# Patient Record
Sex: Female | Born: 1980 | Race: Black or African American | Hispanic: No | Marital: Single | Smoking: Never smoker
Health system: Southern US, Community
[De-identification: ages and names within clinical notes are randomized; demographics above are authoritative.]

## PROBLEM LIST (undated history)

## (undated) DIAGNOSIS — I10 Essential (primary) hypertension: Secondary | ICD-10-CM

## (undated) DIAGNOSIS — M069 Rheumatoid arthritis, unspecified: Secondary | ICD-10-CM

## (undated) HISTORY — PX: TONSILLECTOMY: SUR1361

---

## 2011-11-08 ENCOUNTER — Other Ambulatory Visit (HOSPITAL_COMMUNITY)
Admission: RE | Admit: 2011-11-08 | Discharge: 2011-11-08 | Disposition: A | Discharge status: Home / Self Care | Payer: BC Managed Care – PPO | Source: Ambulatory Visit | Attending: Internal Medicine | Admitting: Internal Medicine

## 2011-11-08 DIAGNOSIS — Z01419 Encounter for gynecological examination (general) (routine) without abnormal findings: Secondary | ICD-10-CM | POA: Insufficient documentation

## 2011-11-08 DIAGNOSIS — Z1151 Encounter for screening for human papillomavirus (HPV): Secondary | ICD-10-CM | POA: Insufficient documentation

## 2012-04-04 ENCOUNTER — Encounter (HOSPITAL_COMMUNITY): Payer: Self-pay | Admitting: *Deleted

## 2012-04-04 ENCOUNTER — Emergency Department (HOSPITAL_COMMUNITY)
Admission: EM | Admit: 2012-04-04 | Discharge: 2012-04-05 | Disposition: A | Discharge status: Home / Self Care | Payer: BC Managed Care – PPO | Attending: Emergency Medicine | Admitting: Emergency Medicine

## 2012-04-04 DIAGNOSIS — M069 Rheumatoid arthritis, unspecified: Secondary | ICD-10-CM | POA: Insufficient documentation

## 2012-04-04 DIAGNOSIS — Z79899 Other long term (current) drug therapy: Secondary | ICD-10-CM | POA: Insufficient documentation

## 2012-04-04 DIAGNOSIS — K8020 Calculus of gallbladder without cholecystitis without obstruction: Secondary | ICD-10-CM

## 2012-04-04 DIAGNOSIS — I10 Essential (primary) hypertension: Secondary | ICD-10-CM | POA: Insufficient documentation

## 2012-04-04 DIAGNOSIS — R110 Nausea: Secondary | ICD-10-CM | POA: Insufficient documentation

## 2012-04-04 DIAGNOSIS — Z3202 Encounter for pregnancy test, result negative: Secondary | ICD-10-CM | POA: Insufficient documentation

## 2012-04-04 DIAGNOSIS — R112 Nausea with vomiting, unspecified: Secondary | ICD-10-CM | POA: Insufficient documentation

## 2012-04-04 HISTORY — DX: Essential (primary) hypertension: I10

## 2012-04-04 HISTORY — DX: Rheumatoid arthritis, unspecified: M06.9

## 2012-04-04 LAB — CBC WITH DIFFERENTIAL/PLATELET
Eosinophils Absolute: 0.1 10*3/uL (ref 0.0–0.7)
Eosinophils Relative: 1 % (ref 0–5)
Lymphs Abs: 4.2 10*3/uL — ABNORMAL HIGH (ref 0.7–4.0)
MCH: 28.5 pg (ref 26.0–34.0)
MCV: 80.3 fL (ref 78.0–100.0)
Monocytes Absolute: 0.7 10*3/uL (ref 0.1–1.0)
Monocytes Relative: 6 % (ref 3–12)
Platelets: 411 10*3/uL — ABNORMAL HIGH (ref 150–400)
RBC: 4 MIL/uL (ref 3.87–5.11)

## 2012-04-04 LAB — URINALYSIS, MICROSCOPIC ONLY
Nitrite: NEGATIVE
Specific Gravity, Urine: 1.017 (ref 1.005–1.030)
Urobilinogen, UA: 0.2 mg/dL (ref 0.0–1.0)

## 2012-04-04 LAB — COMPREHENSIVE METABOLIC PANEL
BUN: 13 mg/dL (ref 6–23)
Calcium: 9.3 mg/dL (ref 8.4–10.5)
Creatinine, Ser: 0.87 mg/dL (ref 0.50–1.10)
GFR calc Af Amer: >90 mL/min (ref >90)
Glucose, Bld: 106 mg/dL — ABNORMAL HIGH (ref 70–99)
Sodium: 141 mEq/L (ref 135–145)
Total Protein: 7.4 g/dL (ref 6.0–8.3)

## 2012-04-04 LAB — LIPASE, BLOOD: Lipase: 46 U/L (ref 11–59)

## 2012-04-04 NOTE — ED Notes (Signed)
Pt c/o RUQ pain radiating to right back since this morning. Nausea, vomited x 1.  Denies dysuria, fever/chills.

## 2012-04-05 ENCOUNTER — Emergency Department (HOSPITAL_COMMUNITY): Payer: BC Managed Care – PPO

## 2012-04-05 MED ORDER — ONDANSETRON HCL 4 MG PO TABS: 4.0000 mg | ORAL_TABLET | Freq: Four times a day (QID) | ORAL | Status: DC

## 2012-04-05 MED ORDER — OXYCODONE-ACETAMINOPHEN 5-325 MG PO TABS: 2.0000 | ORAL_TABLET | ORAL | Status: DC | PRN

## 2012-04-05 MED ORDER — MORPHINE SULFATE 4 MG/ML IJ SOLN
6.0000 mg | INTRAMUSCULAR | Status: DC | PRN
  Administered 2012-04-05: 6 mg via INTRAVENOUS
  Filled 2012-04-05: qty 1
  Filled 2012-04-05: qty 2

## 2012-04-05 MED ORDER — SODIUM CHLORIDE 0.9 % IV SOLN
Freq: Once | INTRAVENOUS | Status: AC
  Administered 2012-04-05: 01:00:00 via INTRAVENOUS

## 2012-04-05 MED ORDER — ONDANSETRON HCL 4 MG/2ML IJ SOLN
4.0000 mg | Freq: Once | INTRAMUSCULAR | Status: AC
  Administered 2012-04-05: 4 mg via INTRAVENOUS
  Filled 2012-04-05: qty 2

## 2012-04-05 NOTE — ED Provider Notes (Signed)
History     CSN: [id number]  Arrival date & time 04/04/12  2228   First MD Initiated Contact with Patient 04/05/12 [phone]      Chief Complaint  Patient presents with  . Abdominal Pain    (Consider location/radiation/quality/duration/timing/severity/associated sxs/prior treatment) HPI Comments: Patient noticed acute onset of right upper quadrant pain after eating breakfast.  This morning, associated with nausea.  The pain persisted waxed and waned throughout the day after dinner which consisted of a chicken wrap.  Pain became excruciating.  She then vomited several times into the emergency room for further evaluation.  Has no known history of gallbladder disease.  She does have a history of rheumatoid arthritis, and hypertension, where she is followed by primary care Dr. And rheumatology  Patient is a 32 y.o. female presenting with abdominal pain. The history is provided by the patient.  Abdominal Pain Pain location:  RUQ Pain quality: aching   Pain radiates to:  Does not radiate Pain severity:  Moderate Timing:  Constant Progression:  Worsening Chronicity:  New Relieved by:  Nothing Worsened by:  Eating Associated symptoms: nausea and vomiting   Associated symptoms: no chest pain, no chills, no diarrhea, no dysuria and no fever     Past Medical History  Diagnosis Date  . Rheumatoid arthritis   . Hypertension     Past Surgical History  Procedure Laterality Date  . Tonsillectomy      History reviewed. No pertinent family history.  History  Substance Use Topics  . Smoking status: Never Smoker   . Smokeless tobacco: Not on file  . Alcohol Use: Yes    OB History   Grav Para Term Preterm Abortions TAB SAB Ect Mult Living                  Review of Systems  Constitutional: Negative for fever and chills.  Cardiovascular: Negative for chest pain and leg swelling.  Gastrointestinal: Positive for nausea, vomiting and abdominal pain. Negative for diarrhea.   Genitourinary: Negative for dysuria and flank pain.  Musculoskeletal: Negative for myalgias.  Skin: Negative for pallor.  All other systems reviewed and are negative.    Allergies  Review of patient's allergies indicates no known allergies.  Home Medications   Current Outpatient Rx  Name  Route  Sig  Dispense  Refill  . diclofenac (VOLTAREN) 75 MG EC tablet   Oral   Take 75 mg by mouth 2 (two) times daily.         Marland Kitchen etanercept (ENBREL) 50 MG/ML injection   Subcutaneous   Inject 50 mg into the skin once a week. Tuesdays         . hydrochlorothiazide (HYDRODIURIL) 25 MG tablet   Oral   Take 25 mg by mouth daily.         . hydroxychloroquine (PLAQUENIL) 200 MG tablet   Oral   Take 200 mg by mouth daily.         Marland Kitchen PRESCRIPTION MEDICATION      Oral contraceptive         . ondansetron (ZOFRAN) 4 MG tablet   Oral   Take 1 tablet (4 mg total) by mouth every 6 (six) hours.   12 tablet   0   . oxyCODONE-acetaminophen (PERCOCET/ROXICET) 5-325 MG per tablet   Oral   Take 2 tablets by mouth every 4 (four) hours as needed for pain.   15 tablet   0     BP 109/57  Pulse  81  Temp(Src) 97.8 F (36.6 C) (Oral)  Resp 18  SpO2 98%  LMP 03/18/2012  Physical Exam  Constitutional: She appears well-developed and well-nourished.  HENT:  Head: Normocephalic.  Cardiovascular: Normal rate and regular rhythm.   Pulmonary/Chest: Effort normal and breath sounds normal.  Abdominal: Soft. Bowel sounds are normal. She exhibits no distension. There is tenderness in the right upper quadrant. There is guarding and positive Murphy's sign.    ED Course  Procedures (including critical care time)  Labs Reviewed  CBC WITH DIFFERENTIAL - Abnormal; Notable for the following:    WBC 10.8 (*)    Hemoglobin 11.4 (*)    HCT 32.1 (*)    Platelets 411 (*)    Lymphs Abs 4.2 (*)    All other components within normal limits  COMPREHENSIVE METABOLIC PANEL - Abnormal; Notable for the  following:    Glucose, Bld 106 (*)    Total Bilirubin 0.2 (*)    GFR calc non Af Amer 88 (*)    All other components within normal limits  URINALYSIS, MICROSCOPIC ONLY - Abnormal; Notable for the following:    Casts HYALINE CASTS (*)    All other components within normal limits  LIPASE, BLOOD  POCT PREGNANCY, URINE  POCT I-STAT TROPONIN I   US Abdomen Complete  04/05/2012  *RADIOLOGY REPORT*  Clinical Data:  Right upper quadrant pain  COMPLETE ABDOMINAL ULTRASOUND  Comparison:  None.  Findings:  Gallbladder:  There is a mobile 7 mm gallstone in the gallbladder. The gallbladder wall is mildly thickened at 3 mm.  There is no pericholecystic fluid.  Negative sonographic Murphy's sign.  Common bile duct:  Normal diameter 5 mm.  Liver:  No focal lesion identified.  Within normal limits in parenchymal echogenicity.  IVC:  Appears normal.  Pancreas:  No focal abnormality seen.  Spleen:  Normal in size and echogenicity.  Right Kidney:  No hydronephrosis appear  Left Kidney:  No hydronephrosis appear  Abdominal aorta:  no aneurysm  IMPRESSION:  Cholelithiasis without cholecystitis.   Original Report Authenticated By: Genevive Bi, M.D.      1. Cholelithiasis       MDM  Patient exam is consistent with acute cholecystitis.  We'll obtain ultrasound, labs reviewed, slight elevation in white count to 10.8, and 0.2 to otherwise, normal         Arman Filter, NP 04/05/12 [phone]

## 2012-04-05 NOTE — ED Provider Notes (Signed)
Medical screening examination/treatment/procedure(s) were conducted as a shared visit with non-physician practitioner(s) and myself.  I personally evaluated the patient during the encounter. Pain resolved. [place] reviewed  RUQ pain with gallstone. ABD s/nt/nd with heart RRR.  [facility] notified, plan outpatient follow up - GB precautions provided.   Sunnie Nielsen, MD 04/05/12 [phone]

## 2012-04-05 NOTE — ED Notes (Signed)
Pt back from US

## 2012-04-05 NOTE — ED Notes (Signed)
CCS paged to [phone] Deanna Higgins

## 2012-04-05 NOTE — ED Notes (Signed)
Patient transported to Ultrasound 

## 2012-04-10 ENCOUNTER — Ambulatory Visit (INDEPENDENT_AMBULATORY_CARE_PROVIDER_SITE_OTHER): Payer: BC Managed Care – PPO | Admitting: General Surgery

## 2012-04-10 ENCOUNTER — Encounter (INDEPENDENT_AMBULATORY_CARE_PROVIDER_SITE_OTHER): Payer: Self-pay | Admitting: General Surgery

## 2012-04-10 ENCOUNTER — Encounter (HOSPITAL_COMMUNITY): Payer: Self-pay | Admitting: Pharmacy Technician

## 2012-04-10 VITALS — BP 128/82 | HR 84 | Temp 98.0°F | Resp 16 | Ht 61.0 in | Wt 161.0 lb

## 2012-04-10 DIAGNOSIS — K8020 Calculus of gallbladder without cholecystitis without obstruction: Secondary | ICD-10-CM

## 2012-04-10 NOTE — Progress Notes (Signed)
Patient ID: Deanna Higgins, female   DOB: Oct 18, 1980, 32 y.o.   MRN: [id number]  Chief Complaint  Patient presents with  . New Evaluation    eval GB    HPI Deanna Higgins is a 32 y.o. female.   HPI 32 yo AAF referred by Dr Theodoro Kalata for evaluation of gallstone disease. The patient states she has had some intermittent episodes over the past couple weeks of right upper quadrant pain radiating to her back. She describes it as an achy sensation. It generally lasts for about 30 minutes to an hour. It was primarily occur in the evening. However a few days ago she had an episode that lasted all day. It was initially intermittent in the morning but became constant later in the evening. She developed nausea and vomiting and the pain was very severe. This prompted her to go to the emergency room for evaluation where an ultrasound was performed which demonstrated gallstones. She had a mild elevated white count at 10.8 but otherwise her labs were within normal limits. Her pain resolved and she was discharged home. She states that she had another mild attack last evening but it resolved after taking ibuprofen. She has lost about 20 pounds over the past couple months which has been planned. She normally has a bowel movement about every 3-4 days. She denies any jaundice or acholic stools. He does take NSAIDs on occasion for her RA. She denies any melena or hematochezia. She denies any reflux Past Medical History  Diagnosis Date  . Rheumatoid arthritis   . Hypertension     Past Surgical History  Procedure Laterality Date  . Tonsillectomy      Family History  Problem Relation Age of Onset  . Hypertension Maternal Aunt   . Cancer Maternal Grandmother     breast  . Diabetes Maternal Grandfather   . Cancer Paternal Grandmother     gastric    Social History History  Substance Use Topics  . Smoking status: Never Smoker   . Smokeless tobacco: Not on file  . Alcohol Use: Yes    No Known  Allergies  Current Outpatient Prescriptions  Medication Sig Dispense Refill  . diclofenac (VOLTAREN) 75 MG EC tablet Take 75 mg by mouth 2 (two) times daily.      Marland Kitchen etanercept (ENBREL) 50 MG/ML injection Inject 50 mg into the skin once a week. Tuesdays      . hydrochlorothiazide (HYDRODIURIL) 25 MG tablet Take 25 mg by mouth daily.      . hydroxychloroquine (PLAQUENIL) 200 MG tablet Take 200 mg by mouth daily.      . ondansetron (ZOFRAN) 4 MG tablet Take 1 tablet (4 mg total) by mouth every 6 (six) hours.  12 tablet  0  . oxyCODONE-acetaminophen (PERCOCET/ROXICET) 5-325 MG per tablet Take 2 tablets by mouth every 4 (four) hours as needed for pain.  15 tablet  0  . PRESCRIPTION MEDICATION Oral contraceptive       No current facility-administered medications for this visit.    Review of Systems Review of Systems  Constitutional: Negative for fever, chills and unexpected weight change.  HENT: Negative for hearing loss, congestion, sore throat, trouble swallowing and voice change.   Eyes: Negative for visual disturbance.  Respiratory: Negative for cough and wheezing.   Cardiovascular: Negative for chest pain, palpitations and leg swelling.  Gastrointestinal: Positive for constipation. Negative for vomiting, diarrhea, blood in stool, abdominal distention and anal bleeding.  Genitourinary: Negative for hematuria, vaginal bleeding  and difficulty urinating.  Musculoskeletal: Negative for arthralgias.  Skin: Negative for rash and wound.  Neurological: Negative for dizziness, seizures, syncope, weakness, numbness and headaches.  Hematological: Negative for adenopathy. Does not bruise/bleed easily.  Psychiatric/Behavioral: Negative for confusion.    Blood pressure 128/82, pulse 84, temperature 98 F (36.7 C), temperature source Temporal, resp. rate 16, height 5\' 1"  (1.549 m), weight 161 lb (73.029 kg), last menstrual period 03/18/2012.  Physical Exam Physical Exam  Vitals  reviewed. Constitutional: She is oriented to person, place, and time. She appears well-developed and well-nourished. No distress.  HENT:  Head: Normocephalic and atraumatic.  Right Ear: External ear normal.  Left Ear: External ear normal.  Eyes: Conjunctivae are normal. No scleral icterus.  Neck: Neck supple. No tracheal deviation present. No thyromegaly present.  Cardiovascular: Normal rate, regular rhythm and normal heart sounds.   Pulmonary/Chest: Effort normal and breath sounds normal. No respiratory distress. She has no wheezes.  Abdominal: Soft. She exhibits no distension. There is no tenderness. There is no rebound and no guarding.  Musculoskeletal: She exhibits no edema and no tenderness.  Lymphadenopathy:    She has no cervical adenopathy.  Neurological: She is alert and oriented to person, place, and time.  Skin: Skin is warm and dry. No rash noted. She is not diaphoretic. No erythema.  Psychiatric: She has a normal mood and affect. Her behavior is normal. Judgment and thought content normal.    Data Reviewed Dr Dierdre Highman notes Labs - cmet wnl   Assessment    Symptomatic cholelithiasis     Plan    I believe the patient's symptoms are consistent with gallbladder disease.  We discussed gallbladder disease. The patient was given Agricultural engineer. We discussed non-operative and operative management. We discussed the signs & symptoms of acute cholecystitis  I discussed laparoscopic cholecystectomy with IOC in detail.  The patient was given educational material as well as diagrams detailing the procedure.  We discussed the risks and benefits of a laparoscopic cholecystectomy including, but not limited to bleeding, infection, injury to surrounding structures such as the intestine or liver, bile leak, retained gallstones, need to convert to an open procedure, prolonged diarrhea, blood clots such as  DVT, common bile duct injury, anesthesia risks, and possible need for additional  procedures.  We discussed the typical post-operative recovery course. I explained that the likelihood of improvement of their symptoms is good.  She will be scheduled for laparoscopic cholecystectomy with interoperative cholangiogram in the near future.  Mary Sella. Andrey Campanile, MD, FACS General, Bariatric, & Minimally Invasive Surgery [facility], [place]          [facility] M 04/10/2012, 9:33 AM

## 2012-04-10 NOTE — Patient Instructions (Signed)

## 2012-04-12 ENCOUNTER — Ambulatory Visit (HOSPITAL_COMMUNITY)
Admission: RE | Admit: 2012-04-12 | Discharge: 2012-04-12 | Disposition: A | Discharge status: Home / Self Care | Payer: BC Managed Care – PPO | Source: Ambulatory Visit | Attending: Anesthesiology | Admitting: Anesthesiology

## 2012-04-12 ENCOUNTER — Other Ambulatory Visit (INDEPENDENT_AMBULATORY_CARE_PROVIDER_SITE_OTHER): Payer: Self-pay | Admitting: General Surgery

## 2012-04-12 ENCOUNTER — Encounter (HOSPITAL_COMMUNITY): Payer: Self-pay

## 2012-04-12 ENCOUNTER — Encounter (HOSPITAL_COMMUNITY)
Admission: RE | Admit: 2012-04-12 | Discharge: 2012-04-12 | Disposition: A | Discharge status: Home / Self Care | Payer: BC Managed Care – PPO | Source: Ambulatory Visit | Attending: General Surgery | Admitting: General Surgery

## 2012-04-12 DIAGNOSIS — Z01812 Encounter for preprocedural laboratory examination: Secondary | ICD-10-CM | POA: Insufficient documentation

## 2012-04-12 DIAGNOSIS — Z01818 Encounter for other preprocedural examination: Secondary | ICD-10-CM | POA: Insufficient documentation

## 2012-04-12 LAB — BASIC METABOLIC PANEL
CO2: 30 mEq/L (ref 19–32)
Chloride: 99 mEq/L (ref 96–112)
Creatinine, Ser: 0.79 mg/dL (ref 0.50–1.10)
GFR calc Af Amer: >90 mL/min (ref >90)
Potassium: 3.6 mEq/L (ref 3.5–5.1)
Sodium: 137 mEq/L (ref 135–145)

## 2012-04-12 LAB — SURGICAL PCR SCREEN
MRSA, PCR: NEGATIVE
Staphylococcus aureus: NEGATIVE

## 2012-04-12 LAB — CBC
Hemoglobin: 11.9 g/dL — ABNORMAL LOW (ref 12.0–15.0)
MCH: 28.4 pg (ref 26.0–34.0)
Platelets: 451 10*3/uL — ABNORMAL HIGH (ref 150–400)
RBC: 4.19 MIL/uL (ref 3.87–5.11)
WBC: 6.8 10*3/uL (ref 4.0–10.5)

## 2012-04-12 LAB — HCG, SERUM, QUALITATIVE: Preg, Serum: NEGATIVE

## 2012-04-12 NOTE — Pre-Procedure Instructions (Signed)
Deanna Higgins  04/12/2012   Your procedure is scheduled on:  Monday, March 17th  Report to [facility] at 8:30 AM.  Call this number if you have problems the morning of surgery: [phone]   Remember:   Do not eat food or drink liquids after midnight.   Take these medicines the morning of surgery with A SIP OF WATER: BC Pill.  May take Oxycodone -Acetaminophen (Percocet)  and Zofran if needed.    Stop taking Aspirin, Coumadin, Plavix, Effient and Herbal medications.  Do not take any NSAIDs ie:  Diclofenac (Voltaren)Ibuprofen,  Advil,Naproxen or any medication containing Aspirin.    Do not wear jewelry, make-up or nail polish.  Do not wear lotions, powders, or perfumes.   Do not shave 48 hours prior to surgery.   Do not bring valuables to the hospital.  Contacts, dentures or bridgework may not be worn into surgery.  Leave suitcase in the car. After surgery it may be brought to your room.  For patients admitted to the hospital, checkout time is 11:00 AM the day of discharge.   Patients discharged the day of surgery will not be allowed to drive home.  Name and phone number of your driver: -   Special Instructions: Shower using CHG 2 nights before surgery and the night before surgery.  If you shower the day of surgery use CHG.  Use special wash - you have one bottle of CHG for all showers.  You should use approximately 1/3 of the bottle for each shower.   Please read over the following fact sheets that you were given: Pain Booklet, Coughing and Deep Breathing and Surgical Site Infection Prevention

## 2012-04-14 MED ORDER — DEXTROSE 5 % IV SOLN
2.0000 g | INTRAVENOUS | Status: AC
  Administered 2012-04-15: 2 g via INTRAVENOUS
  Filled 2012-04-14: qty 2

## 2012-04-15 ENCOUNTER — Encounter (HOSPITAL_COMMUNITY): Payer: Self-pay | Admitting: *Deleted

## 2012-04-15 ENCOUNTER — Ambulatory Visit (HOSPITAL_COMMUNITY): Payer: BC Managed Care – PPO | Admitting: Anesthesiology

## 2012-04-15 ENCOUNTER — Encounter (HOSPITAL_COMMUNITY)
Admission: RE | Disposition: A | Discharge status: Home / Self Care | Payer: Self-pay | Source: Ambulatory Visit | Attending: General Surgery

## 2012-04-15 ENCOUNTER — Ambulatory Visit (HOSPITAL_COMMUNITY)
Admission: RE | Admit: 2012-04-15 | Discharge: 2012-04-15 | Disposition: A | Discharge status: Home / Self Care | Payer: BC Managed Care – PPO | Source: Ambulatory Visit | Attending: General Surgery | Admitting: General Surgery

## 2012-04-15 ENCOUNTER — Encounter (HOSPITAL_COMMUNITY): Payer: Self-pay | Admitting: Anesthesiology

## 2012-04-15 DIAGNOSIS — Z79899 Other long term (current) drug therapy: Secondary | ICD-10-CM | POA: Insufficient documentation

## 2012-04-15 DIAGNOSIS — K824 Cholesterolosis of gallbladder: Secondary | ICD-10-CM

## 2012-04-15 DIAGNOSIS — M069 Rheumatoid arthritis, unspecified: Secondary | ICD-10-CM | POA: Insufficient documentation

## 2012-04-15 DIAGNOSIS — K8010 Calculus of gallbladder with chronic cholecystitis without obstruction: Secondary | ICD-10-CM

## 2012-04-15 DIAGNOSIS — I10 Essential (primary) hypertension: Secondary | ICD-10-CM | POA: Insufficient documentation

## 2012-04-15 HISTORY — PX: CHOLECYSTECTOMY: SHX55

## 2012-04-15 SURGERY — LAPAROSCOPIC CHOLECYSTECTOMY WITH INTRAOPERATIVE CHOLANGIOGRAM
Anesthesia: General | Site: Abdomen | Wound class: Contaminated

## 2012-04-15 MED ORDER — ACETAMINOPHEN 10 MG/ML IV SOLN
1000.0000 mg | Freq: Once | INTRAVENOUS | Status: AC
  Administered 2012-04-15: 1000 mg via INTRAVENOUS

## 2012-04-15 MED ORDER — PROPOFOL 10 MG/ML IV BOLUS
INTRAVENOUS | Status: DC | PRN
  Administered 2012-04-15: 200 mg via INTRAVENOUS

## 2012-04-15 MED ORDER — ACETAMINOPHEN 650 MG RE SUPP
650.0000 mg | RECTAL | Status: DC | PRN
  Filled 2012-04-15: qty 1

## 2012-04-15 MED ORDER — ROCURONIUM BROMIDE 100 MG/10ML IV SOLN
INTRAVENOUS | Status: DC | PRN
  Administered 2012-04-15: 10 mg via INTRAVENOUS
  Administered 2012-04-15: 40 mg via INTRAVENOUS

## 2012-04-15 MED ORDER — LIDOCAINE HCL (CARDIAC) 20 MG/ML IV SOLN
INTRAVENOUS | Status: DC | PRN
  Administered 2012-04-15: 80 mg via INTRAVENOUS

## 2012-04-15 MED ORDER — GLYCOPYRROLATE 0.2 MG/ML IJ SOLN
INTRAMUSCULAR | Status: DC | PRN
  Administered 2012-04-15: 0.6 mg via INTRAVENOUS
  Administered 2012-04-15: 0.2 mg via INTRAVENOUS

## 2012-04-15 MED ORDER — HYDROMORPHONE HCL PF 1 MG/ML IJ SOLN
INTRAMUSCULAR | Status: AC
  Filled 2012-04-15: qty 1

## 2012-04-15 MED ORDER — LACTATED RINGERS IV SOLN
INTRAVENOUS | Status: DC
  Administered 2012-04-15: 10:00:00 via INTRAVENOUS

## 2012-04-15 MED ORDER — SODIUM CHLORIDE 0.9 % IJ SOLN: 3.0000 mL | INTRAMUSCULAR | Status: DC | PRN

## 2012-04-15 MED ORDER — HYDROMORPHONE HCL PF 1 MG/ML IJ SOLN
0.2500 mg | INTRAMUSCULAR | Status: DC | PRN
  Administered 2012-04-15 (×2): 0.5 mg via INTRAVENOUS

## 2012-04-15 MED ORDER — SODIUM CHLORIDE 0.9 % IR SOLN
Status: DC | PRN
  Administered 2012-04-15: 1000 mL

## 2012-04-15 MED ORDER — SODIUM CHLORIDE 0.9 % IV SOLN: 250.0000 mL | INTRAVENOUS | Status: DC | PRN

## 2012-04-15 MED ORDER — SODIUM CHLORIDE 0.9 % IJ SOLN: 3.0000 mL | Freq: Two times a day (BID) | INTRAMUSCULAR | Status: DC

## 2012-04-15 MED ORDER — MORPHINE SULFATE 2 MG/ML IJ SOLN: 1.0000 mg | INTRAMUSCULAR | Status: DC | PRN

## 2012-04-15 MED ORDER — ACETAMINOPHEN 325 MG PO TABS
650.0000 mg | ORAL_TABLET | ORAL | Status: DC | PRN
  Filled 2012-04-15: qty 2

## 2012-04-15 MED ORDER — DEXAMETHASONE SODIUM PHOSPHATE 4 MG/ML IJ SOLN
INTRAMUSCULAR | Status: DC | PRN
  Administered 2012-04-15: 8 mg via INTRAVENOUS

## 2012-04-15 MED ORDER — ONDANSETRON HCL 4 MG/2ML IJ SOLN
INTRAMUSCULAR | Status: DC | PRN
  Administered 2012-04-15: 4 mg via INTRAVENOUS

## 2012-04-15 MED ORDER — MIDAZOLAM HCL 5 MG/5ML IJ SOLN
INTRAMUSCULAR | Status: DC | PRN
  Administered 2012-04-15 (×2): 1 mg via INTRAVENOUS

## 2012-04-15 MED ORDER — LIDOCAINE HCL 4 % MT SOLN
OROMUCOSAL | Status: DC | PRN
  Administered 2012-04-15: 4 mL via TOPICAL

## 2012-04-15 MED ORDER — METOCLOPRAMIDE HCL 5 MG/ML IJ SOLN: 10.0000 mg | Freq: Once | INTRAMUSCULAR | Status: DC | PRN

## 2012-04-15 MED ORDER — PHENYLEPHRINE HCL 10 MG/ML IJ SOLN
INTRAMUSCULAR | Status: DC | PRN
  Administered 2012-04-15: 120 ug via INTRAVENOUS
  Administered 2012-04-15: 80 ug via INTRAVENOUS

## 2012-04-15 MED ORDER — ARTIFICIAL TEARS OP OINT
TOPICAL_OINTMENT | OPHTHALMIC | Status: DC | PRN
  Administered 2012-04-15: 1 via OPHTHALMIC

## 2012-04-15 MED ORDER — OXYCODONE HCL 5 MG/5ML PO SOLN: 5.0000 mg | Freq: Once | ORAL | Status: DC | PRN

## 2012-04-15 MED ORDER — SODIUM CHLORIDE 0.9 % IV SOLN
INTRAVENOUS | Status: DC | PRN
  Administered 2012-04-15: 11:00:00

## 2012-04-15 MED ORDER — LACTATED RINGERS IV SOLN
INTRAVENOUS | Status: DC | PRN
  Administered 2012-04-15 (×2): via INTRAVENOUS

## 2012-04-15 MED ORDER — OXYCODONE HCL 5 MG PO TABS: 5.0000 mg | ORAL_TABLET | Freq: Once | ORAL | Status: DC | PRN

## 2012-04-15 MED ORDER — ACETAMINOPHEN 10 MG/ML IV SOLN
INTRAVENOUS | Status: AC
  Filled 2012-04-15: qty 100

## 2012-04-15 MED ORDER — BUPIVACAINE-EPINEPHRINE 0.25% -1:200000 IJ SOLN
INTRAMUSCULAR | Status: AC
  Filled 2012-04-15: qty 1

## 2012-04-15 MED ORDER — OXYCODONE HCL 5 MG PO TABS: 5.0000 mg | ORAL_TABLET | ORAL | Status: DC | PRN

## 2012-04-15 MED ORDER — OXYCODONE-ACETAMINOPHEN 5-325 MG PO TABS: 1.0000 | ORAL_TABLET | ORAL | Status: AC | PRN

## 2012-04-15 MED ORDER — ONDANSETRON HCL 4 MG/2ML IJ SOLN: 4.0000 mg | Freq: Four times a day (QID) | INTRAMUSCULAR | Status: DC | PRN

## 2012-04-15 MED ORDER — BUPIVACAINE-EPINEPHRINE 0.25% -1:200000 IJ SOLN
INTRAMUSCULAR | Status: DC | PRN
  Administered 2012-04-15: 50 mL

## 2012-04-15 MED ORDER — NEOSTIGMINE METHYLSULFATE 1 MG/ML IJ SOLN
INTRAMUSCULAR | Status: DC | PRN
  Administered 2012-04-15: 4 mg via INTRAVENOUS

## 2012-04-15 MED ORDER — SODIUM CHLORIDE 0.9 % IV SOLN: INTRAVENOUS | Status: DC

## 2012-04-15 MED ORDER — FENTANYL CITRATE 0.05 MG/ML IJ SOLN
INTRAMUSCULAR | Status: DC | PRN
  Administered 2012-04-15 (×2): 50 ug via INTRAVENOUS
  Administered 2012-04-15: 100 ug via INTRAVENOUS

## 2012-04-15 SURGICAL SUPPLY — 43 items
APPLIER CLIP 5 13 M/L LIGAMAX5 (MISCELLANEOUS) ×2
BANDAGE ADHESIVE 1X3 (GAUZE/BANDAGES/DRESSINGS) IMPLANT
BENZOIN TINCTURE PRP APPL 2/3 (GAUZE/BANDAGES/DRESSINGS) IMPLANT
BLADE SURG ROTATE 9660 (MISCELLANEOUS) ×2 IMPLANT
CANISTER SUCTION 2500CC (MISCELLANEOUS) ×2 IMPLANT
CHLORAPREP W/TINT 26ML (MISCELLANEOUS) ×2 IMPLANT
CLIP APPLIE 5 13 M/L LIGAMAX5 (MISCELLANEOUS) ×1 IMPLANT
CLOTH BEACON ORANGE TIMEOUT ST (SAFETY) ×2 IMPLANT
COVER MAYO STAND STRL (DRAPES) ×2 IMPLANT
COVER SURGICAL LIGHT HANDLE (MISCELLANEOUS) ×2 IMPLANT
DECANTER SPIKE VIAL GLASS SM (MISCELLANEOUS) ×2 IMPLANT
DERMABOND ADVANCED (GAUZE/BANDAGES/DRESSINGS) ×1
DERMABOND ADVANCED .7 DNX12 (GAUZE/BANDAGES/DRESSINGS) ×1 IMPLANT
DRAPE C-ARM 42X72 X-RAY (DRAPES) ×2 IMPLANT
DRAPE UTILITY 15X26 W/TAPE STR (DRAPE) ×4 IMPLANT
DRSG TEGADERM 4X4.75 (GAUZE/BANDAGES/DRESSINGS) IMPLANT
ELECT REM PT RETURN 9FT ADLT (ELECTROSURGICAL) ×2
ELECTRODE REM PT RTRN 9FT ADLT (ELECTROSURGICAL) ×1 IMPLANT
GAUZE SPONGE 2X2 8PLY STRL LF (GAUZE/BANDAGES/DRESSINGS) IMPLANT
GLOVE BIOGEL M STRL SZ7.5 (GLOVE) ×2 IMPLANT
GLOVE BIOGEL PI IND STRL 7.5 (GLOVE) ×1 IMPLANT
GLOVE BIOGEL PI IND STRL 8 (GLOVE) ×1 IMPLANT
GLOVE BIOGEL PI INDICATOR 7.5 (GLOVE) ×1
GLOVE BIOGEL PI INDICATOR 8 (GLOVE) ×1
GOWN PREVENTION PLUS XLARGE (GOWN DISPOSABLE) ×2 IMPLANT
GOWN STRL NON-REIN LRG LVL3 (GOWN DISPOSABLE) ×6 IMPLANT
KIT BASIN OR (CUSTOM PROCEDURE TRAY) ×2 IMPLANT
KIT ROOM TURNOVER OR (KITS) ×2 IMPLANT
NS IRRIG 1000ML POUR BTL (IV SOLUTION) ×2 IMPLANT
PAD ARMBOARD 7.5X6 YLW CONV (MISCELLANEOUS) ×2 IMPLANT
POUCH SPECIMEN RETRIEVAL 10MM (ENDOMECHANICALS) ×2 IMPLANT
SCISSORS LAP 5X35 DISP (ENDOMECHANICALS) IMPLANT
SET CHOLANGIOGRAPH 5 50 .035 (SET/KITS/TRAYS/PACK) ×2 IMPLANT
SET IRRIG TUBING LAPAROSCOPIC (IRRIGATION / IRRIGATOR) ×2 IMPLANT
SLEEVE ENDOPATH XCEL 5M (ENDOMECHANICALS) ×4 IMPLANT
SPECIMEN JAR SMALL (MISCELLANEOUS) ×2 IMPLANT
SPONGE GAUZE 2X2 STER 10/PKG (GAUZE/BANDAGES/DRESSINGS)
SUT MNCRL AB 4-0 PS2 18 (SUTURE) ×2 IMPLANT
TOWEL OR 17X24 6PK STRL BLUE (TOWEL DISPOSABLE) ×2 IMPLANT
TOWEL OR 17X26 10 PK STRL BLUE (TOWEL DISPOSABLE) ×2 IMPLANT
TRAY LAPAROSCOPIC (CUSTOM PROCEDURE TRAY) ×2 IMPLANT
TROCAR XCEL BLUNT TIP 100MML (ENDOMECHANICALS) ×2 IMPLANT
TROCAR XCEL NON-BLD 5MMX100MML (ENDOMECHANICALS) ×2 IMPLANT

## 2012-04-15 NOTE — Transfer of Care (Signed)
Immediate Anesthesia Transfer of Care Note  Patient: Deanna Higgins  Procedure(s) Performed: Procedure(s): LAPAROSCOPIC CHOLECYSTECTOMY,  ATTEMPTED INTRAOPERATIVE CHOLANGIOGRAM (N/A)  Patient Location: PACU  Anesthesia Type:General  Level of Consciousness: awake, alert  and oriented  Airway & Oxygen Therapy: Patient Spontanous Breathing and Patient connected to nasal cannula oxygen  Post-op Assessment: Report given to PACU RN, Post -op Vital signs reviewed and stable and Patient moving all extremities X 4  Post vital signs: Reviewed and stable  Complications: No apparent anesthesia complications

## 2012-04-15 NOTE — Anesthesia Preprocedure Evaluation (Signed)
Anesthesia Evaluation  Patient identified by MRN, date of birth, ID band Patient awake    Reviewed: Allergy & Precautions, H&P , NPO status , Patient's Chart, lab work & pertinent test results, reviewed documented beta blocker date and time   Airway Mallampati: II TM Distance: >3 FB Neck ROM: full    Dental   Pulmonary neg pulmonary ROS,  breath sounds clear to auscultation        Cardiovascular hypertension, On Medications negative cardio ROS  Rhythm:regular     Neuro/Psych negative neurological ROS  negative psych ROS   GI/Hepatic negative GI ROS, Neg liver ROS,   Endo/Other  negative endocrine ROS  Renal/GU negative Renal ROS  negative genitourinary   Musculoskeletal   Abdominal   Peds  Hematology negative hematology ROS (+)   Anesthesia Other Findings See surgeon's H&P   Reproductive/Obstetrics negative OB ROS                           Anesthesia Physical Anesthesia Plan  ASA: II  Anesthesia Plan: General   Post-op Pain Management:    Induction: Intravenous  Airway Management Planned: Oral ETT  Additional Equipment:   Intra-op Plan:   Post-operative Plan: Extubation in OR  Informed Consent: I have reviewed the patients History and Physical, chart, labs and discussed the procedure including the risks, benefits and alternatives for the proposed anesthesia with the patient or authorized representative who has indicated his/her understanding and acceptance.   Dental Advisory Given  Plan Discussed with: CRNA and Surgeon  Anesthesia Plan Comments:         Anesthesia Quick Evaluation

## 2012-04-15 NOTE — H&P (View-Only) (Signed)
Patient ID: Deanna Higgins, female   DOB: 27-Nov-1980, 32 y.o.   MRN: [id number]  Chief Complaint  Patient presents with  . New Evaluation    eval GB    HPI Deanna Higgins is a 32 y.o. female.   HPI 32 yo AAF referred by Dr Theodoro Kalata for evaluation of gallstone disease. The patient states she has had some intermittent episodes over the past couple weeks of right upper quadrant pain radiating to her back. She describes it as an achy sensation. It generally lasts for about 30 minutes to an hour. It was primarily occur in the evening. However a few days ago she had an episode that lasted all day. It was initially intermittent in the morning but became constant later in the evening. She developed nausea and vomiting and the pain was very severe. This prompted her to go to the emergency room for evaluation where an ultrasound was performed which demonstrated gallstones. She had a mild elevated white count at 10.8 but otherwise her labs were within normal limits. Her pain resolved and she was discharged home. She states that she had another mild attack last evening but it resolved after taking ibuprofen. She has lost about 20 pounds over the past couple months which has been planned. She normally has a bowel movement about every 3-4 days. She denies any jaundice or acholic stools. He does take NSAIDs on occasion for her RA. She denies any melena or hematochezia. She denies any reflux Past Medical History  Diagnosis Date  . Rheumatoid arthritis   . Hypertension     Past Surgical History  Procedure Laterality Date  . Tonsillectomy      Family History  Problem Relation Age of Onset  . Hypertension Maternal Aunt   . Cancer Maternal Grandmother     breast  . Diabetes Maternal Grandfather   . Cancer Paternal Grandmother     gastric    Social History History  Substance Use Topics  . Smoking status: Never Smoker   . Smokeless tobacco: Not on file  . Alcohol Use: Yes    No Known  Allergies  Current Outpatient Prescriptions  Medication Sig Dispense Refill  . diclofenac (VOLTAREN) 75 MG EC tablet Take 75 mg by mouth 2 (two) times daily.      Marland Kitchen etanercept (ENBREL) 50 MG/ML injection Inject 50 mg into the skin once a week. Tuesdays      . hydrochlorothiazide (HYDRODIURIL) 25 MG tablet Take 25 mg by mouth daily.      . hydroxychloroquine (PLAQUENIL) 200 MG tablet Take 200 mg by mouth daily.      . ondansetron (ZOFRAN) 4 MG tablet Take 1 tablet (4 mg total) by mouth every 6 (six) hours.  12 tablet  0  . oxyCODONE-acetaminophen (PERCOCET/ROXICET) 5-325 MG per tablet Take 2 tablets by mouth every 4 (four) hours as needed for pain.  15 tablet  0  . PRESCRIPTION MEDICATION Oral contraceptive       No current facility-administered medications for this visit.    Review of Systems Review of Systems  Constitutional: Negative for fever, chills and unexpected weight change.  HENT: Negative for hearing loss, congestion, sore throat, trouble swallowing and voice change.   Eyes: Negative for visual disturbance.  Respiratory: Negative for cough and wheezing.   Cardiovascular: Negative for chest pain, palpitations and leg swelling.  Gastrointestinal: Positive for constipation. Negative for vomiting, diarrhea, blood in stool, abdominal distention and anal bleeding.  Genitourinary: Negative for hematuria, vaginal bleeding  and difficulty urinating.  Musculoskeletal: Negative for arthralgias.  Skin: Negative for rash and wound.  Neurological: Negative for dizziness, seizures, syncope, weakness, numbness and headaches.  Hematological: Negative for adenopathy. Does not bruise/bleed easily.  Psychiatric/Behavioral: Negative for confusion.    Blood pressure 128/82, pulse 84, temperature 98 F (36.7 C), temperature source Temporal, resp. rate 16, height 5\' 1"  (1.549 m), weight 161 lb (73.029 kg), last menstrual period 03/18/2012.  Physical Exam Physical Exam  Vitals  reviewed. Constitutional: She is oriented to person, place, and time. She appears well-developed and well-nourished. No distress.  HENT:  Head: Normocephalic and atraumatic.  Right Ear: External ear normal.  Left Ear: External ear normal.  Eyes: Conjunctivae are normal. No scleral icterus.  Neck: Neck supple. No tracheal deviation present. No thyromegaly present.  Cardiovascular: Normal rate, regular rhythm and normal heart sounds.   Pulmonary/Chest: Effort normal and breath sounds normal. No respiratory distress. She has no wheezes.  Abdominal: Soft. She exhibits no distension. There is no tenderness. There is no rebound and no guarding.  Musculoskeletal: She exhibits no edema and no tenderness.  Lymphadenopathy:    She has no cervical adenopathy.  Neurological: She is alert and oriented to person, place, and time.  Skin: Skin is warm and dry. No rash noted. She is not diaphoretic. No erythema.  Psychiatric: She has a normal mood and affect. Her behavior is normal. Judgment and thought content normal.    Data Reviewed Dr Dierdre Highman notes Labs - cmet wnl   Assessment    Symptomatic cholelithiasis     Plan    I believe the patient's symptoms are consistent with gallbladder disease.  We discussed gallbladder disease. The patient was given Agricultural engineer. We discussed non-operative and operative management. We discussed the signs & symptoms of acute cholecystitis  I discussed laparoscopic cholecystectomy with IOC in detail.  The patient was given educational material as well as diagrams detailing the procedure.  We discussed the risks and benefits of a laparoscopic cholecystectomy including, but not limited to bleeding, infection, injury to surrounding structures such as the intestine or liver, bile leak, retained gallstones, need to convert to an open procedure, prolonged diarrhea, blood clots such as  DVT, common bile duct injury, anesthesia risks, and possible need for additional  procedures.  We discussed the typical post-operative recovery course. I explained that the likelihood of improvement of their symptoms is good.  She will be scheduled for laparoscopic cholecystectomy with interoperative cholangiogram in the near future.  Mary Sella. Andrey Campanile, MD, FACS General, Bariatric, & Minimally Invasive Surgery [facility], [place]          [facility] M 04/10/2012, 9:33 AM

## 2012-04-15 NOTE — Anesthesia Postprocedure Evaluation (Signed)
  Anesthesia Post-op Note  Patient: Deanna Higgins  Procedure(s) Performed: Procedure(s): LAPAROSCOPIC CHOLECYSTECTOMY,  ATTEMPTED INTRAOPERATIVE CHOLANGIOGRAM (N/A)  Patient Location: PACU  Anesthesia Type:General  Level of Consciousness: awake, alert  and oriented  Airway and Oxygen Therapy: Patient Spontanous Breathing and Patient connected to nasal cannula oxygen  Post-op Pain: mild  Post-op Assessment: Post-op Vital signs reviewed, Patient's Cardiovascular Status Stable, Respiratory Function Stable, Patent Airway, No signs of Nausea or vomiting and Pain level controlled  Post-op Vital Signs: stable  Complications: No apparent anesthesia complications

## 2012-04-15 NOTE — Interval H&P Note (Signed)
History and Physical Interval Note:  04/15/2012 10:18 AM  Deanna Higgins  has presented today for surgery, with the diagnosis of symptomatic cholelithiasis   The various methods of treatment have been discussed with the patient and family. After consideration of risks, benefits and other options for treatment, the patient has consented to  Procedure(s): LAPAROSCOPIC CHOLECYSTECTOMY WITH INTRAOPERATIVE CHOLANGIOGRAM (N/A) as a surgical intervention .  The patient's history has been reviewed, patient examined, no change in status, stable for surgery.  I have reviewed the patient's chart and labs.  Questions were answered to the patient's satisfaction.    Mary Sella. Andrey Campanile, MD, FACS General, Bariatric, & Minimally Invasive Surgery [facility], [place]   [facility] M

## 2012-04-15 NOTE — Preoperative (Signed)
Beta Blockers   Reason not to administer Beta Blockers: not prescribed 

## 2012-04-15 NOTE — Op Note (Signed)
Laparoscopic Cholecystectomy Procedure Note  Indications: This patient presents with symptomatic gallbladder disease and will undergo laparoscopic cholecystectomy.  Pre-operative Diagnosis: symptomatic cholelithiasis  Post-operative Diagnosis: Calculus of gallbladder with other cholecystitis, without mention of obstruction  Surgeon: Atilano Ina   Assistants: none  Anesthesia: General endotracheal anesthesia  ASA Class: 2  Procedure Details  The patient was seen again in the Holding Room. The risks, benefits, complications, treatment options, and expected outcomes were discussed with the patient. The possibilities of reaction to medication, pulmonary aspiration, perforation of viscus, bleeding, recurrent infection, finding a normal gallbladder, the need for additional procedures, failure to diagnose a condition, the possible need to convert to an open procedure, and creating a complication requiring transfusion or operation were discussed with the patient. The likelihood of improving the patient's symptoms with return to their baseline status is good.  The patient and/or family concurred with the proposed plan, giving informed consent. The site of surgery properly noted. The patient was taken to Operating Room, identified as Deanna Higgins and the procedure verified as Laparoscopic Cholecystectomy with Intraoperative Cholangiogram. A Time Out was held and the above information confirmed.  Prior to the induction of general anesthesia, antibiotic prophylaxis was administered. General endotracheal anesthesia was then administered and tolerated well. After the induction, the abdomen was prepped with Chloraprep and draped in the sterile fashion. The patient was positioned in the supine position.  Local anesthetic agent was injected into the skin near the umbilicus and an incision made. We dissected down to the abdominal fascia with blunt dissection.  The fascia was incised vertically and we  entered the peritoneal cavity bluntly.  A pursestring suture of 0-Vicryl was placed around the fascial opening.  The Hasson cannula was inserted and secured with the stay suture.  Pneumoperitoneum was then created with CO2 and tolerated well without any adverse changes in the patient's vital signs. An 5-mm port was placed in the subxiphoid position.  Two 5-mm ports were placed in the right upper quadrant. All skin incisions were infiltrated with a local anesthetic agent before making the incision and placing the trocars.   We positioned the patient in reverse Trendelenburg, tilted slightly to the patient's left.  The gallbladder was identified, the fundus grasped and retracted cephalad. Adhesions were lysed bluntly and with the electrocautery where indicated, taking care not to injure any adjacent organs or viscus. The infundibulum was grasped and retracted laterally, exposing the peritoneum overlying the triangle of Calot. This was then divided and exposed in a blunt fashion. A critical view of the cystic duct and cystic artery was obtained.  The cystic duct was clearly identified and bluntly dissected circumferentially. The cystic duct was ligated with a clip distally.   An incision was made in the cystic duct and the [facility] cholangiogram catheter was attempted to be introduced. Despite multiple attempts to thread the catheter I was unable too.  The catheter was then removed.   The cystic duct was then ligated with clips and divided. The cystic artery was identified, dissected free, ligated with clips and divided as well.   The gallbladder was dissected from the liver bed in retrograde fashion with the electrocautery. The gallbladder was removed and placed in an Endocatch sac.  The gallbladder and Endocatch sac were then removed through the umbilical port site. The liver bed was irrigated and inspected. Hemostasis was achieved with the electrocautery. Copious irrigation was utilized and was repeatedly  aspirated until clear.  The pursestring suture was used to close the  umbilical fascia.    We again inspected the right upper quadrant for hemostasis.  The umbilical closure was inspected and there was no air leak and nothing trapped within the closure. Pneumoperitoneum was released as we removed the trocars.  4-0 Monocryl was used to close the skin.   Dermabond was applied. The patient was then extubated and brought to the recovery room in stable condition. Instrument, sponge, and needle counts were correct at closure and at the conclusion of the case.   Findings: Chronic Cholecystitis with Cholelithiasis (edematous wall)  Estimated Blood Loss: Minimal         Drains: none         Specimens: Gallbladder           Complications: None; patient tolerated the procedure well.         Disposition: PACU - hemodynamically stable.         Condition: stable  Deanna Higgins. Andrey Campanile, MD, FACS General, Bariatric, & Minimally Invasive Surgery [facility], [place]

## 2012-04-15 NOTE — Anesthesia Procedure Notes (Signed)
Procedure Name: Intubation Date/Time: 04/15/2012 10:55 AM Performed by: Gayla Medicus Pre-anesthesia Checklist: Patient identified, Timeout performed, Emergency Drugs available, Suction available and Patient being monitored Patient Re-evaluated:Patient Re-evaluated prior to inductionOxygen Delivery Method: Circle system utilized Preoxygenation: Pre-oxygenation with 100% oxygen Intubation Type: IV induction Ventilation: Mask ventilation without difficulty and Oral airway inserted - appropriate to patient size Laryngoscope Size: Mac and 3 Grade View: Grade I Tube type: Oral Tube size: 7.5 mm Number of attempts: 1 Airway Equipment and Method: Stylet and LTA kit utilized Placement Confirmation: ETT inserted through vocal cords under direct vision,  positive ETCO2 and breath sounds checked- equal and bilateral Secured at: 21 cm Tube secured with: Tape Dental Injury: Teeth and Oropharynx as per pre-operative assessment

## 2012-04-16 ENCOUNTER — Encounter (HOSPITAL_COMMUNITY): Payer: Self-pay | Admitting: General Surgery

## 2012-05-06 ENCOUNTER — Encounter (INDEPENDENT_AMBULATORY_CARE_PROVIDER_SITE_OTHER): Payer: BC Managed Care – PPO | Admitting: General Surgery

## 2012-05-07 ENCOUNTER — Encounter (INDEPENDENT_AMBULATORY_CARE_PROVIDER_SITE_OTHER): Payer: Self-pay | Admitting: General Surgery

## 2013-08-13 ENCOUNTER — Other Ambulatory Visit (HOSPITAL_COMMUNITY)
Admission: RE | Admit: 2013-08-13 | Discharge: 2013-08-13 | Disposition: A | Discharge status: Home / Self Care | Payer: BC Managed Care – PPO | Source: Ambulatory Visit | Attending: Internal Medicine | Admitting: Internal Medicine

## 2013-08-13 ENCOUNTER — Other Ambulatory Visit: Payer: Self-pay | Admitting: Internal Medicine

## 2013-08-13 DIAGNOSIS — Z01419 Encounter for gynecological examination (general) (routine) without abnormal findings: Secondary | ICD-10-CM | POA: Insufficient documentation

## 2013-08-13 DIAGNOSIS — Z1151 Encounter for screening for human papillomavirus (HPV): Secondary | ICD-10-CM | POA: Insufficient documentation

## 2013-08-15 LAB — CYTOLOGY - PAP

## 2014-07-27 IMAGING — US US ABDOMEN COMPLETE
1 series · 14 of 25 positions shown · non-contrast
Comparison: None.

CLINICAL DATA: Right upper quadrant pain

COMPLETE ABDOMINAL ULTRASOUND

[Series 1: us abdomen complete · 0.23mm/px · 14 of 97 slices shown]
[im 1/97]
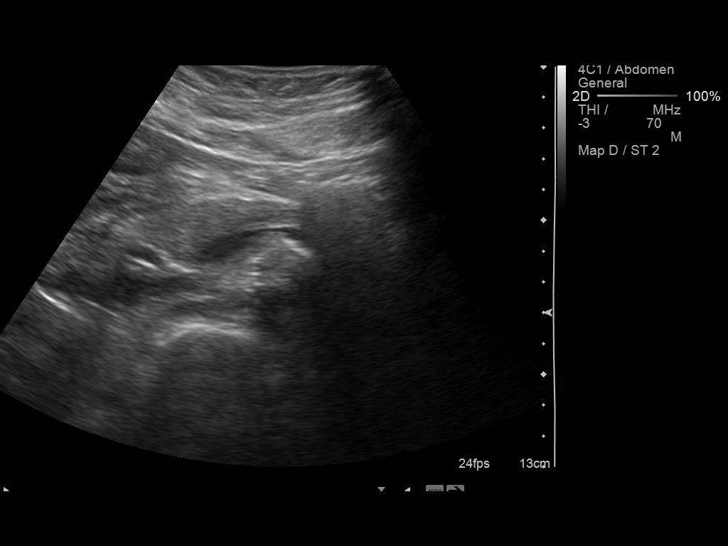
[im 9/97]
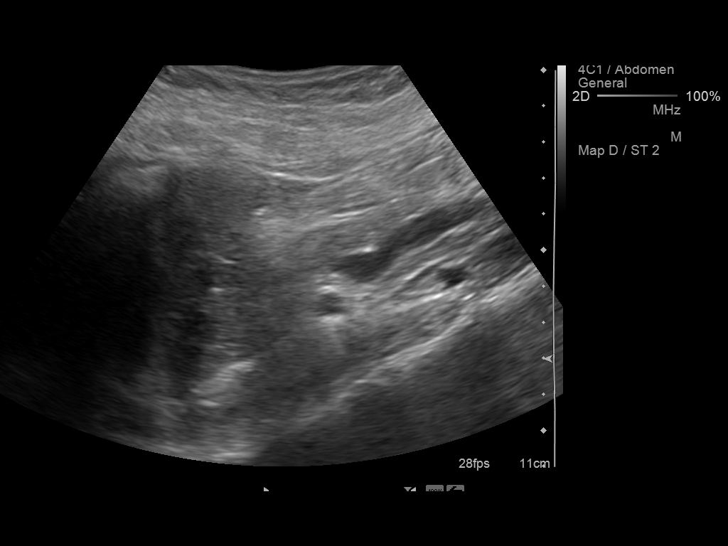
[im 17/97]
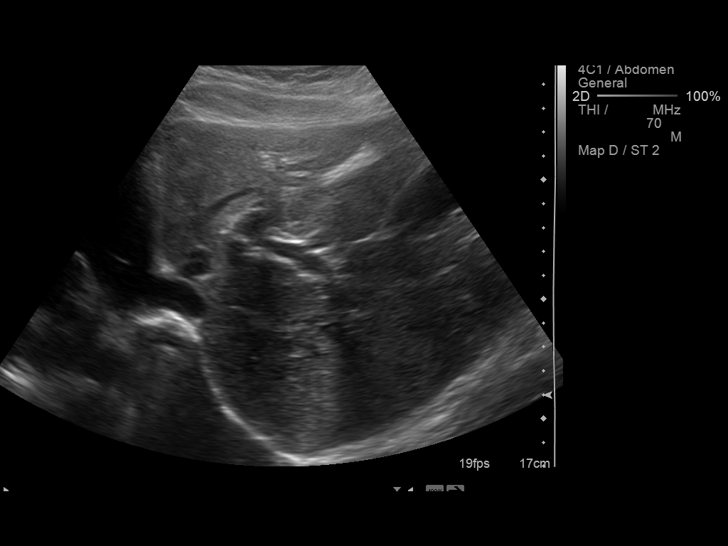
[im 25/97]
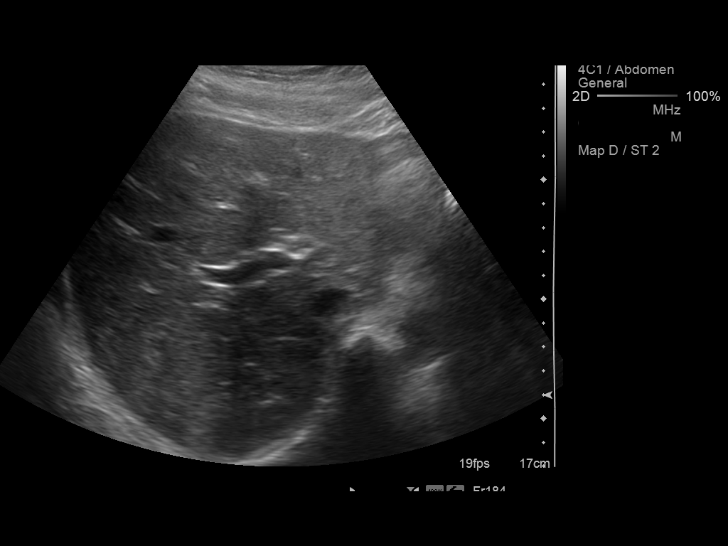
[im 33/97]
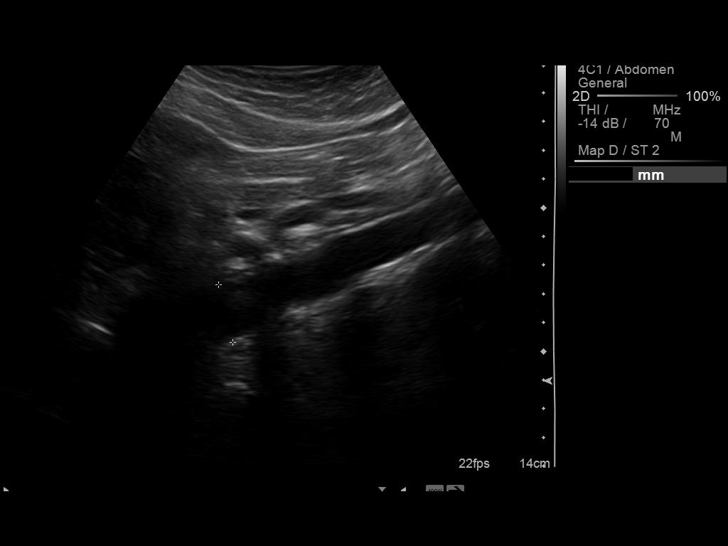
[im 37/97]
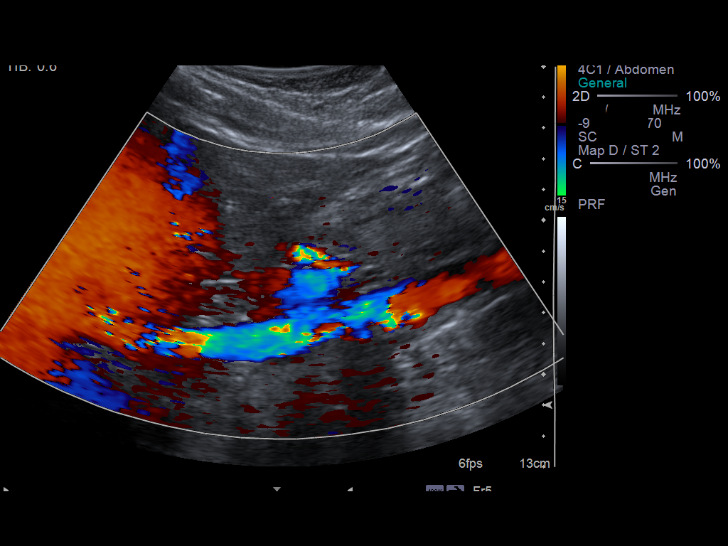
[im 45/97]
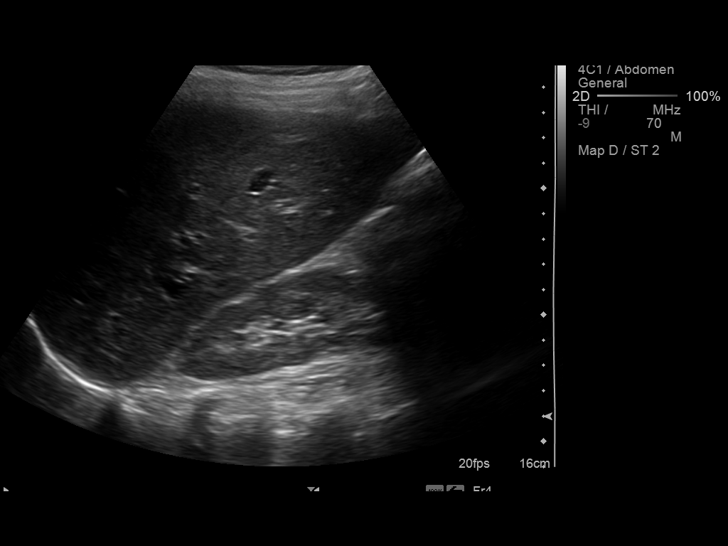
[im 53/97]
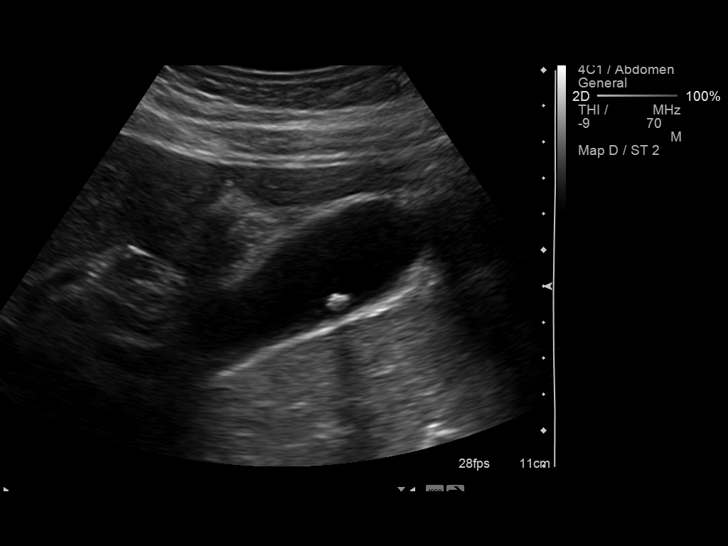
[im 61/97]
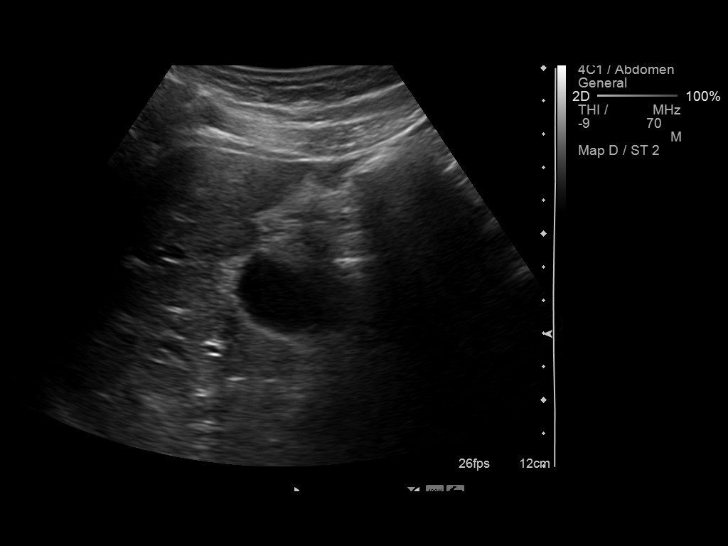
[im 65/97]
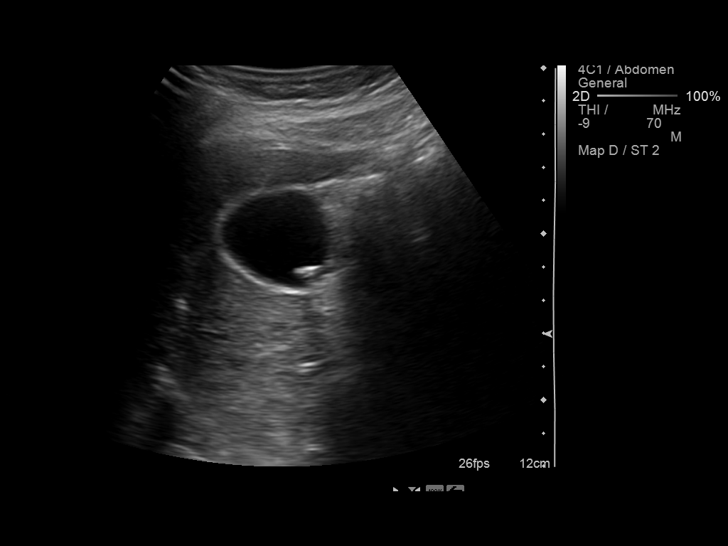
[im 73/97]
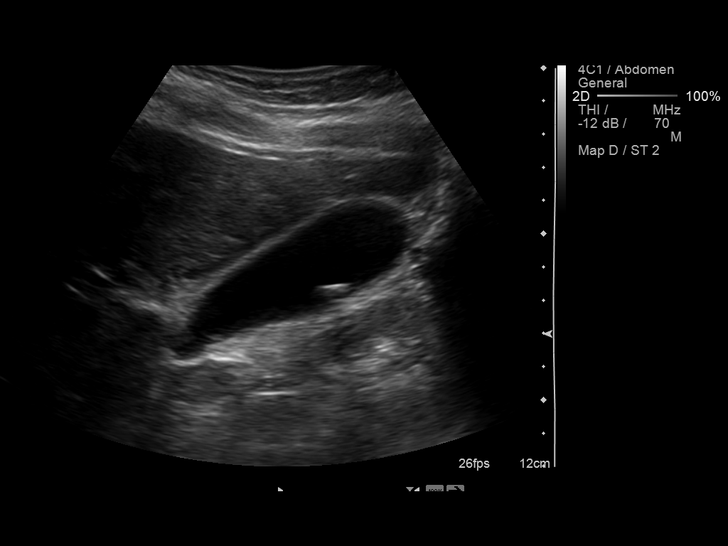
[im 81/97]
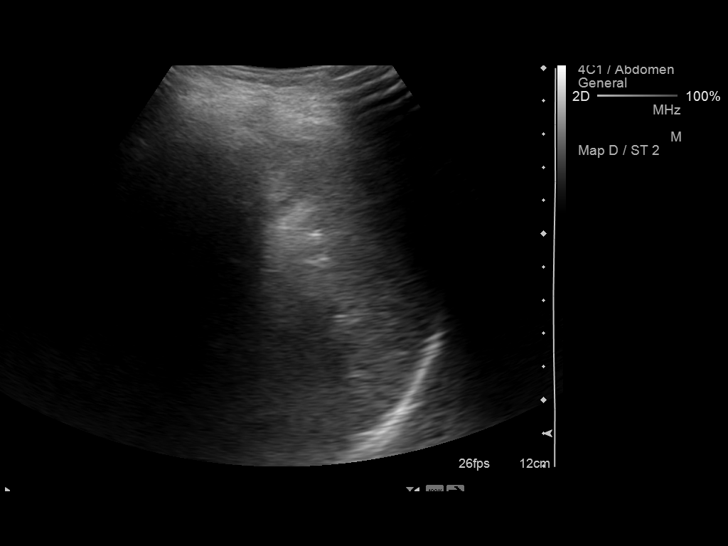
[im 89/97]
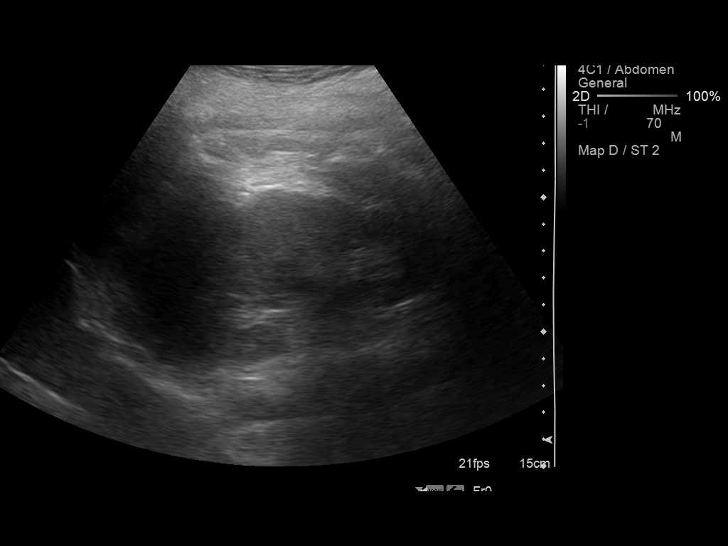
[im 97/97]
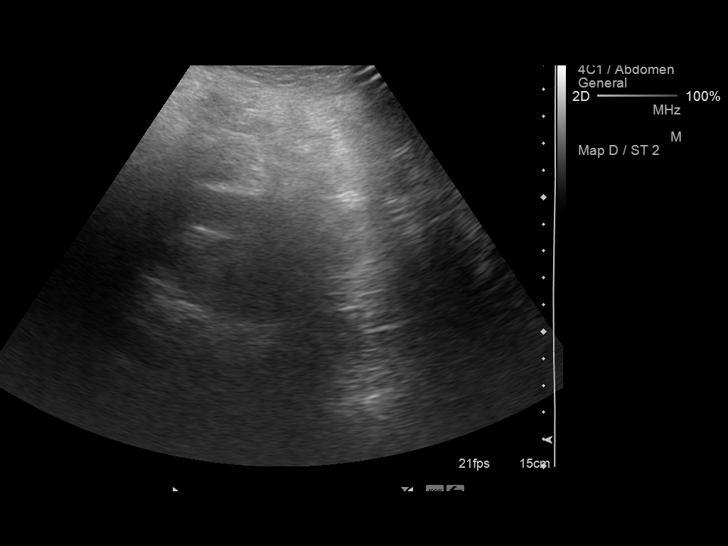

[14 of 25 positions shown; findings below may reference images not displayed]

FINDINGS: Gallbladder:  There is a mobile 7 mm gallstone in the gallbladder.
The gallbladder wall is mildly thickened at 3 mm.  There is no
pericholecystic fluid.  Negative sonographic Murphy's sign.

Common bile duct:  Normal diameter 5 mm.

Liver:  No focal lesion identified.  Within normal limits in
parenchymal echogenicity.

IVC:  Appears normal.

Pancreas:  No focal abnormality seen.

Spleen:  Normal in size and echogenicity.

Right Kidney:  No hydronephrosis appear

Left Kidney:  No hydronephrosis appear

Abdominal aorta:  no aneurysm
IMPRESSION: Cholelithiasis without cholecystitis.

## 2015-09-15 ENCOUNTER — Encounter (HOSPITAL_COMMUNITY): Payer: Self-pay | Admitting: *Deleted

## 2015-09-15 ENCOUNTER — Emergency Department (HOSPITAL_COMMUNITY)
Admission: EM | Admit: 2015-09-15 | Discharge: 2015-09-16 | Disposition: A | Discharge status: Home / Self Care | Payer: BLUE CROSS/BLUE SHIELD | Attending: Emergency Medicine | Admitting: Emergency Medicine

## 2015-09-15 DIAGNOSIS — O034 Incomplete spontaneous abortion without complication: Secondary | ICD-10-CM | POA: Diagnosis not present

## 2015-09-15 DIAGNOSIS — Z3490 Encounter for supervision of normal pregnancy, unspecified, unspecified trimester: Secondary | ICD-10-CM

## 2015-09-15 DIAGNOSIS — Z3A08 8 weeks gestation of pregnancy: Secondary | ICD-10-CM | POA: Insufficient documentation

## 2015-09-15 DIAGNOSIS — O039 Complete or unspecified spontaneous abortion without complication: Secondary | ICD-10-CM

## 2015-09-15 DIAGNOSIS — O161 Unspecified maternal hypertension, first trimester: Secondary | ICD-10-CM | POA: Insufficient documentation

## 2015-09-15 DIAGNOSIS — O209 Hemorrhage in early pregnancy, unspecified: Secondary | ICD-10-CM | POA: Diagnosis present

## 2015-09-15 LAB — URINE MICROSCOPIC-ADD ON

## 2015-09-15 LAB — COMPREHENSIVE METABOLIC PANEL
ALK PHOS: 87 U/L (ref 38–126)
ALT: 19 U/L (ref 14–54)
AST: 24 U/L (ref 15–41)
Albumin: 4 g/dL (ref 3.5–5.0)
Anion gap: 6 (ref 5–15)
BUN: 8 mg/dL (ref 6–20)
CHLORIDE: 103 mmol/L (ref 101–111)
CO2: 26 mmol/L (ref 22–32)
CREATININE: 0.84 mg/dL (ref 0.44–1.00)
Calcium: 9.4 mg/dL (ref 8.9–10.3)
GFR calc Af Amer: >60 mL/min (ref >60)
Glucose, Bld: 104 mg/dL — ABNORMAL HIGH (ref 65–99)
Potassium: 4 mmol/L (ref 3.5–5.1)
SODIUM: 135 mmol/L (ref 135–145)
Total Bilirubin: 0.5 mg/dL (ref 0.3–1.2)
Total Protein: 7.4 g/dL (ref 6.5–8.1)

## 2015-09-15 LAB — URINALYSIS, ROUTINE W REFLEX MICROSCOPIC
BILIRUBIN URINE: NEGATIVE
GLUCOSE, UA: NEGATIVE mg/dL
KETONES UR: NEGATIVE mg/dL
Leukocytes, UA: NEGATIVE
Nitrite: NEGATIVE
PROTEIN: NEGATIVE mg/dL
Specific Gravity, Urine: 1.009 (ref 1.005–1.030)
pH: 6 (ref 5.0–8.0)

## 2015-09-15 LAB — CBC
HCT: 37.8 % (ref 36.0–46.0)
Hemoglobin: 13.2 g/dL (ref 12.0–15.0)
MCH: 29.1 pg (ref 26.0–34.0)
MCHC: 34.9 g/dL (ref 30.0–36.0)
MCV: 83.3 fL (ref 78.0–100.0)
PLATELETS: 390 10*3/uL (ref 150–400)
RBC: 4.54 MIL/uL (ref 3.87–5.11)
RDW: 14.5 % (ref 11.5–15.5)
WBC: 8.7 10*3/uL (ref 4.0–10.5)

## 2015-09-15 LAB — LIPASE, BLOOD: LIPASE: 26 U/L (ref 11–51)

## 2015-09-15 LAB — I-STAT BETA HCG BLOOD, ED (MC, WL, AP ONLY): I-stat hCG, quantitative: >2000 m[IU]/mL — ABNORMAL HIGH (ref <5)

## 2015-09-15 NOTE — ED Triage Notes (Signed)
Pt reports being approx [redacted] weeks pregnant. Having light vaginal bleeding this am with lower back pain.

## 2015-09-16 ENCOUNTER — Emergency Department (HOSPITAL_COMMUNITY): Payer: BLUE CROSS/BLUE SHIELD

## 2015-09-16 MED ORDER — HYDROCODONE-ACETAMINOPHEN 5-325 MG PO TABS: 2.0000 | ORAL_TABLET | ORAL | 0 refills | Status: AC | PRN

## 2015-09-16 NOTE — ED Notes (Signed)
Patient transported to Ultrasound 

## 2015-09-16 NOTE — ED Provider Notes (Signed)
[facility]-EMERGENCY DEPT Provider Note   CSN: [id number] Arrival date & time: 09/15/15  1649  By signing my name below, I, Christel MormonMatthew Jamison, attest that this documentation has been prepared under the direction and in the presence of Shon Batonourtney F Horton, MD . Electronically Signed: Christel MormonMatthew Jamison, Scribe. 09/16/2015. 2:06 AM.    History   Chief Complaint Chief Complaint  Patient presents with  . Abdominal Pain  . Vaginal Bleeding  . Back Pain    lower     The history is provided by the patient. No language interpreter was used.     HPI Comments:  Deanna Higgins is a 35 y.o. female who is currently [redacted] weeks pregnant and has a hx of rheumatoid arthritis presents to the Emergency Department complaining of sudden onset, unchanged, moderate vaginal bleeding onset ~ 10 hours ago. She notes associated intermittent low back pain that started this morning and lower abdominal pain. Her LMP was noted on 07/03/15. G2/P1/A0. She takes Enbrel for her RA and is currently on pre-natal vitamins. Pt has her first OB appointment on 08/18. Pt denies nausea or dysuria. She has no known drug allergies.      Past Medical History:  Diagnosis Date  . Hypertension   . Rheumatoid arthritis(714.0)     Patient Active Problem List   Diagnosis Date Noted  . Symptomatic cholelithiasis 04/10/2012    Past Surgical History:  Procedure Laterality Date  . CHOLECYSTECTOMY N/A 04/15/2012   Procedure: LAPAROSCOPIC CHOLECYSTECTOMY,  ATTEMPTED INTRAOPERATIVE CHOLANGIOGRAM;  Surgeon: Atilano InaEric M Wilson, MD;  [facility];  Service: General;  Laterality: N/A;  . TONSILLECTOMY      OB History    No data available       Home Medications    Prior to Admission medications   Medication Sig Start Date End Date Taking? Authorizing Provider  diclofenac (VOLTAREN) 75 MG EC tablet Take 75 mg by mouth 2 (two) times daily.    Historical Provider, MD  etanercept (ENBREL) 50 MG/ML injection Inject 50 mg into the skin once a  week. Tuesdays    Historical Provider, MD  hydrochlorothiazide (HYDRODIURIL) 25 MG tablet Take 25 mg by mouth daily.    Historical Provider, MD  HYDROcodone-acetaminophen (NORCO/VICODIN) 5-325 MG tablet Take 2 tablets by mouth every 4 (four) hours as needed. 09/16/15   Shon Batonourtney F Horton, MD  hydroxychloroquine (PLAQUENIL) 200 MG tablet Take 200 mg by mouth 2 (two) times daily.     Historical Provider, MD  ondansetron (ZOFRAN) 4 MG tablet Take 4 mg by mouth every 8 (eight) hours as needed for nausea. Nausea    Historical Provider, MD  oxyCODONE-acetaminophen (ROXICET) 5-325 MG per tablet Take 1-2 tablets by mouth every 4 (four) hours as needed for pain. 04/15/12   Gaynelle AduEric Wilson, MD  PRESCRIPTION MEDICATION Oral contraceptive, patient does not remember name of this but will bring in with her to her procedure.    Historical Provider, MD    Family History Family History  Problem Relation Age of Onset  . Hypertension Maternal Aunt   . Cancer Maternal Grandmother     breast  . Diabetes Maternal Grandfather   . Cancer Paternal Grandmother     gastric    Social History Social History  Substance Use Topics  . Smoking status: Never Smoker  . Smokeless tobacco: Not on file  . Alcohol use Yes     Comment: occ     Allergies   Review of patient's allergies indicates no known allergies.  Review of Systems Review of Systems  Constitutional: Negative for fever.  Gastrointestinal: Positive for abdominal pain. Negative for nausea.  Genitourinary: Positive for vaginal bleeding. Negative for dysuria.  Musculoskeletal: Positive for back pain.  All other systems reviewed and are negative.    Physical Exam Updated Vital Signs BP 126/74   Pulse 65   Temp 98.4 F (36.9 C) (Oral)   Resp 14   LMP 07/03/2015   SpO2 100%   Physical Exam  Constitutional: She is oriented to person, place, and time. She appears well-developed and well-nourished.  HENT:  Head: Normocephalic and atraumatic.    Cardiovascular: Normal rate, regular rhythm and normal heart sounds.   Pulmonary/Chest: Effort normal. No respiratory distress. She has no wheezes.  Abdominal: Soft. Bowel sounds are normal. There is no tenderness. There is no guarding.  Genitourinary:  Genitourinary Comments: Moderate bleeding noted in the vaginal vault, clot noted, cervical os open  Neurological: She is alert and oriented to person, place, and time.  Skin: Skin is warm and dry.  Psychiatric: She has a normal mood and affect.  Nursing note and vitals reviewed.    ED Treatments / Results  DIAGNOSTIC STUDIES:  Oxygen Saturation is 100% on room air, normal by my interpretation.    COORDINATION OF CARE:  12:43 AM Will order abdominal US and pelvic exam. Discussed treatment plan with pt at bedside and pt agreed to plan.   Labs (all labs ordered are listed, but only abnormal results are displayed) Labs Reviewed  COMPREHENSIVE METABOLIC PANEL - Abnormal; Notable for the following:       Result Value   Glucose, Bld 104 (*)    All other components within normal limits  URINALYSIS, ROUTINE W REFLEX MICROSCOPIC (NOT AT [facility]) - Abnormal; Notable for the following:    Hgb urine dipstick LARGE (*)    All other components within normal limits  URINE MICROSCOPIC-ADD ON - Abnormal; Notable for the following:    Squamous Epithelial / LPF 0-5 (*)    Bacteria, UA FEW (*)    All other components within normal limits  I-STAT BETA HCG BLOOD, ED ([facility], [facility], [facility] ONLY) - Abnormal; Notable for the following:    I-stat hCG, quantitative >2,000.0 (*)    All other components within normal limits  LIPASE, BLOOD  CBC    EKG  EKG Interpretation None       Radiology US Ob Comp Less 14 Wks  Result Date: 09/16/2015 CLINICAL DATA:  36 y/o F; pregnant patient with vaginal bleeding. Gestational age by LMP 10 weeks and 5 days. EXAM: OBSTETRIC <14 WK [place] AND TRANSVAGINAL OB US TECHNIQUE: Both transabdominal and transvaginal ultrasound  examinations were performed for complete evaluation of the gestation as well as the maternal uterus, adnexal regions, and pelvic cul-de-sac. Transvaginal technique was performed to assess early pregnancy. COMPARISON:  None. FINDINGS: Intrauterine gestational sac: Single Embryo:  Present. Cardiac Activity: None detected with M-mode Doppler, CT, or color Doppler. Heart Rate: 0  bpm CRL:  2.23 cm  mm   8 w   6 d                  [place] EDC: 04/21/2016 Subchorionic hemorrhage:  None visualized. Maternal uterus/adnexae: Fundal fibroid measuring 2.3 x 2.8 x 3.0 cm. Bilateral ovaries are morphologically normal. The left ovary has a corpus luteal cyst measuring 2.4 x 1.7 x 2.0 cm. IMPRESSION: Findings meet definitive criteria for failed pregnancy. This follows SRU consensus guidelines: Diagnostic Criteria for Nonviable  Pregnancy Early in the First Trimester. Macy Mis Engl J Med [phone];369:1443-51. Electronically Signed   By: Mitzi HansenLance  Furusawa-Stratton M.D.   On: 09/16/2015 02:23   [place] Ob Transvaginal  Result Date: 09/16/2015 CLINICAL DATA:  35 y/o F; pregnant patient with vaginal bleeding. Gestational age by LMP 10 weeks and 5 days. EXAM: OBSTETRIC <14 WK US AND TRANSVAGINAL OB US TECHNIQUE: Both transabdominal and transvaginal ultrasound examinations were performed for complete evaluation of the gestation as well as the maternal uterus, adnexal regions, and pelvic cul-de-sac. Transvaginal technique was performed to assess early pregnancy. COMPARISON:  None. FINDINGS: Intrauterine gestational sac: Single Embryo:  Present. Cardiac Activity: None detected with M-mode Doppler, CT, or color Doppler. Heart Rate: 0  bpm CRL:  2.23 cm  mm   8 w   6 d                  US EDC: 04/21/2016 Subchorionic hemorrhage:  None visualized. Maternal uterus/adnexae: Fundal fibroid measuring 2.3 x 2.8 x 3.0 cm. Bilateral ovaries are morphologically normal. The left ovary has a corpus luteal cyst measuring 2.4 x 1.7 x 2.0 cm. IMPRESSION: Findings meet  definitive criteria for failed pregnancy. This follows SRU consensus guidelines: Diagnostic Criteria for Nonviable Pregnancy Early in the First Trimester. Macy Mis Engl J Med [phone]. Electronically Signed   By: Mitzi HansenLance  Furusawa-Stratton M.D.   On: 09/16/2015 02:23    Procedures Procedures (including critical care time)  Medications Ordered in ED Medications - No data to display   Initial Impression / Assessment and Plan / ED Course  I have reviewed the triage vital signs and the nursing notes.  Pertinent labs & imaging results that were available during my care of the patient were reviewed by me and considered in my medical decision making (see chart for details).  Clinical Course    Patient presents with bleeding during pregnancy. She reports that she is approximately [redacted] weeks pregnant. Ultrasound is concerning for failed pregnancy with no fetal heart tones. Pelvic exam is concerning for inevitable AB. Patient appears to be having a spontaneous abortion.  She is clinically well appearing. Will discharge him with pain medication and expectant management. Follow-up with her OB. She has an appointment on Friday. She was given strict return precautions.  After history, exam, and medical workup I feel the patient has been appropriately medically screened and is safe for discharge home. Pertinent diagnoses were discussed with the patient. Patient was given return precautions.   Final Clinical Impressions(s) / ED Diagnoses   Final diagnoses:  Pregnant  Miscarriage    New Prescriptions New Prescriptions   HYDROCODONE-ACETAMINOPHEN (NORCO/VICODIN) 5-325 MG TABLET    Take 2 tablets by mouth every 4 (four) hours as needed.   I personally performed the services described in this documentation, which was scribed in my presence. The recorded information has been reviewed and is accurate.     Shon Batonourtney F Horton, MD 09/16/15 [phone]

## 2015-09-16 NOTE — ED Notes (Signed)
Pt not in room.

## 2015-09-16 NOTE — ED Notes (Signed)
Pelvic setup at bedside; pt undressed and ready; MD made aware

## 2015-09-16 NOTE — Discharge Instructions (Signed)
Called her OB for follow-up. If you soak through more than 1 pad an hour, begin to feel dizzy or have any new or worsening symptoms she needs to be reevaluated.

## 2015-09-16 NOTE — ED Notes (Signed)
Horton, MD at bedside.  

## 2015-09-17 ENCOUNTER — Other Ambulatory Visit: Payer: Self-pay | Admitting: Obstetrics and Gynecology

## 2017-11-19 IMAGING — US US OB COMP LESS 14 WK
1 series · 14 of 28 positions shown · non-contrast
Comparison: None.

CLINICAL DATA: 35 y/o F; pregnant patient with vaginal bleeding.
Gestational age by LMP 10 weeks and 5 days.

EXAM:
OBSTETRIC <14 WK US AND TRANSVAGINAL OB US
TECHNIQUE: Both transabdominal and transvaginal ultrasound examinations were
performed for complete evaluation of the gestation as well as the
maternal uterus, adnexal regions, and pelvic cul-de-sac.
Transvaginal technique was performed to assess early pregnancy.

[Series 1: us ob comp less 14 wk · 0.09mm/px · 14 of 99 slices shown]
[im 4/99]
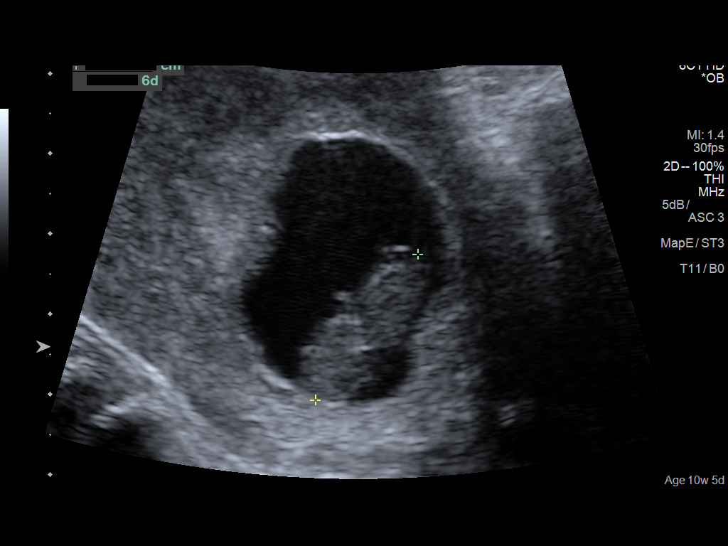
[im 11/99]
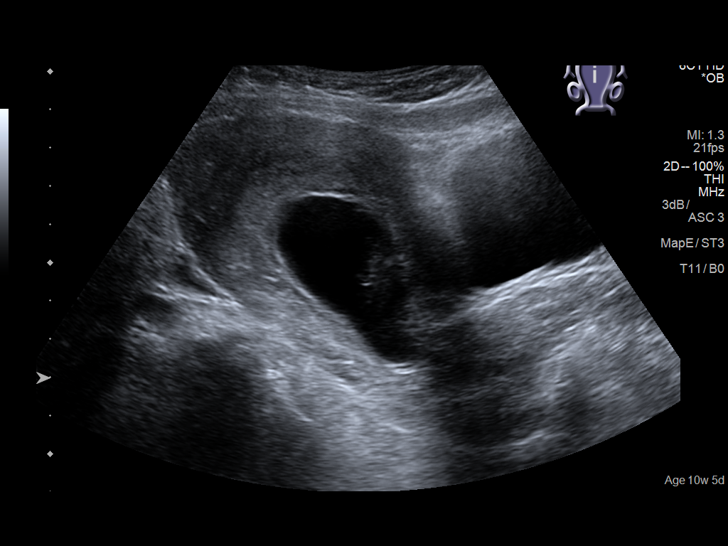
[im 19/99]
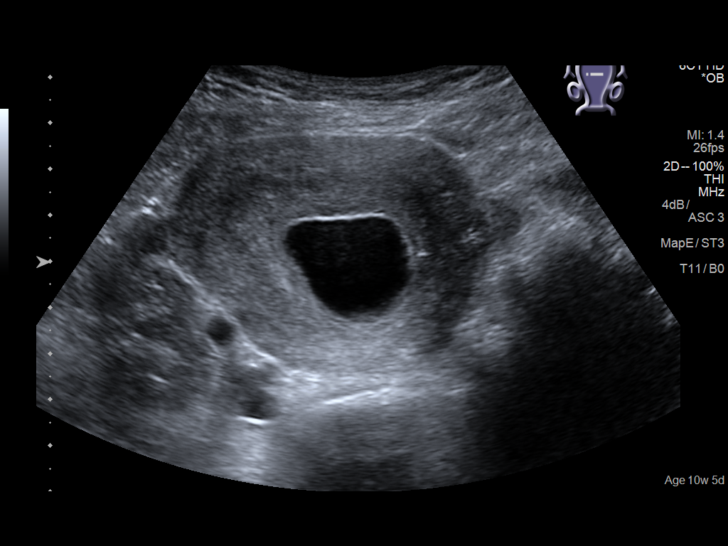
[im 26/99]
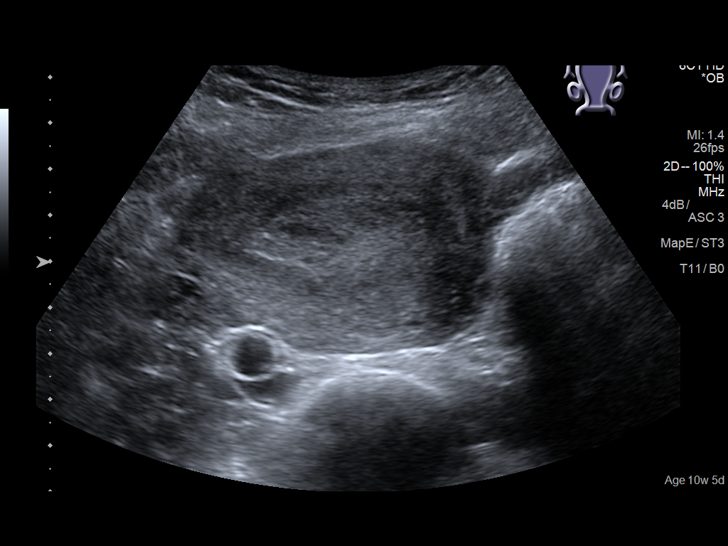
[im 33/99]
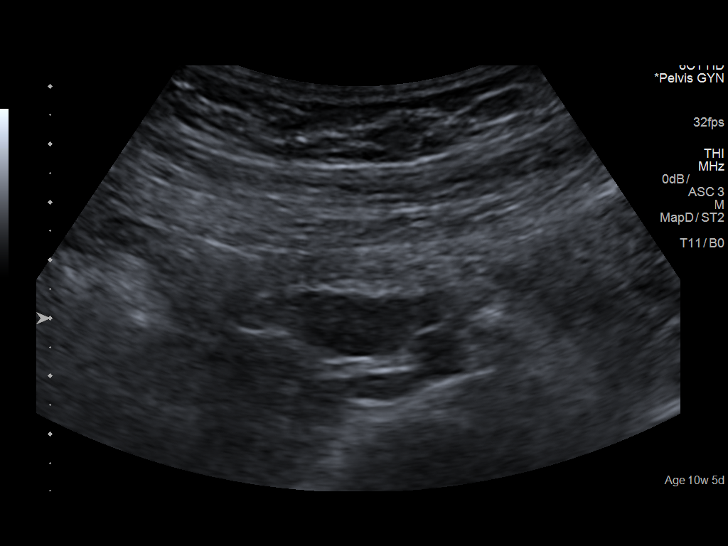
[im 40/99]
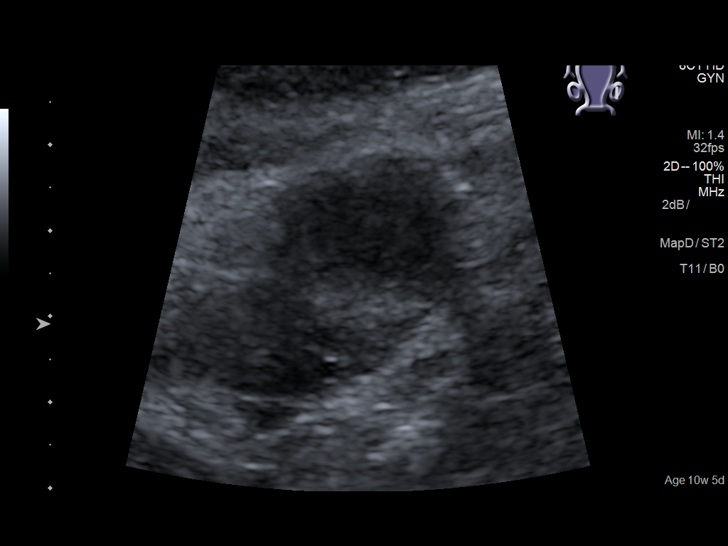
[im 48/99]
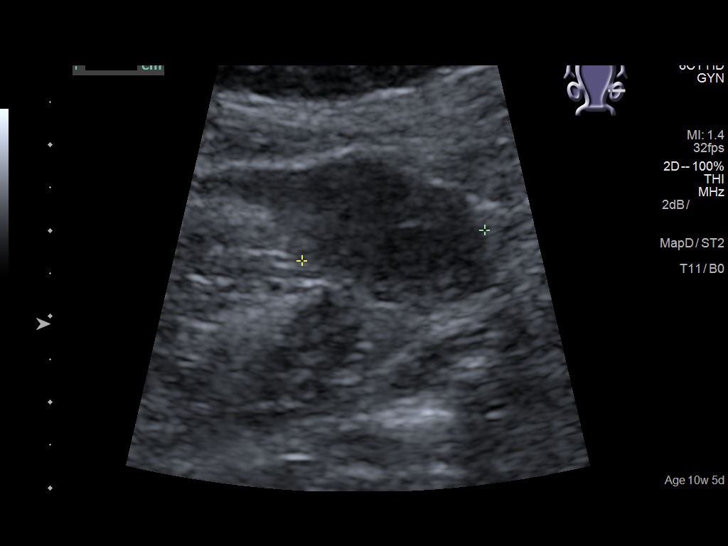
[im 55/99]
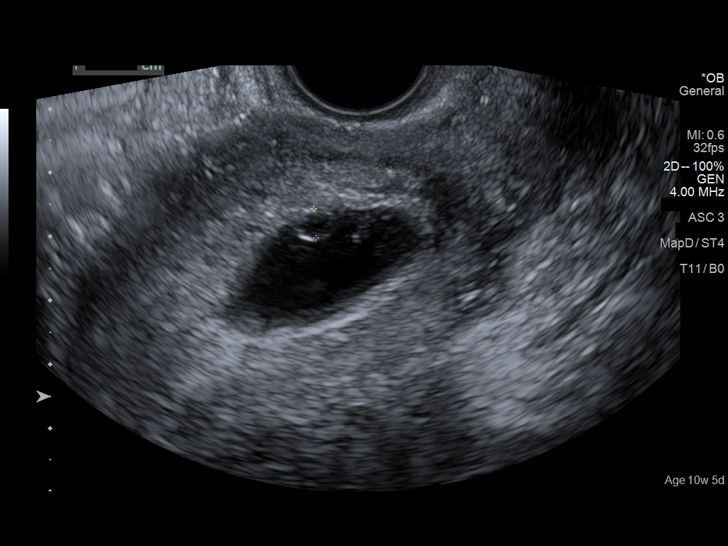
[im 62/99]
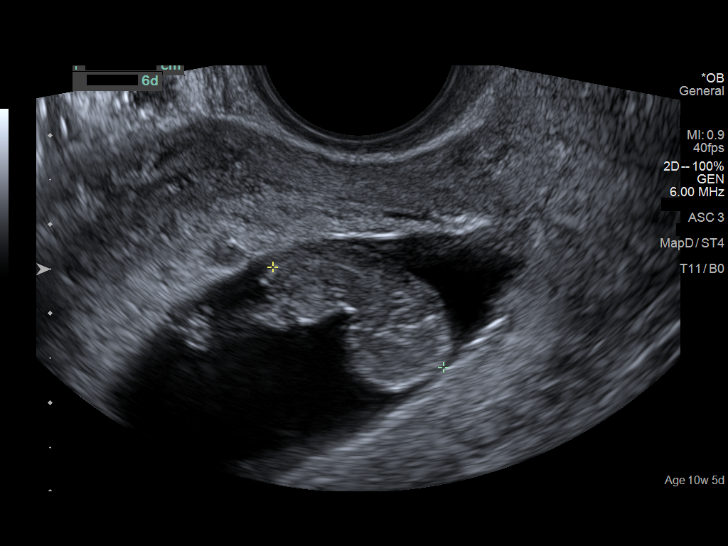
[im 69/99]
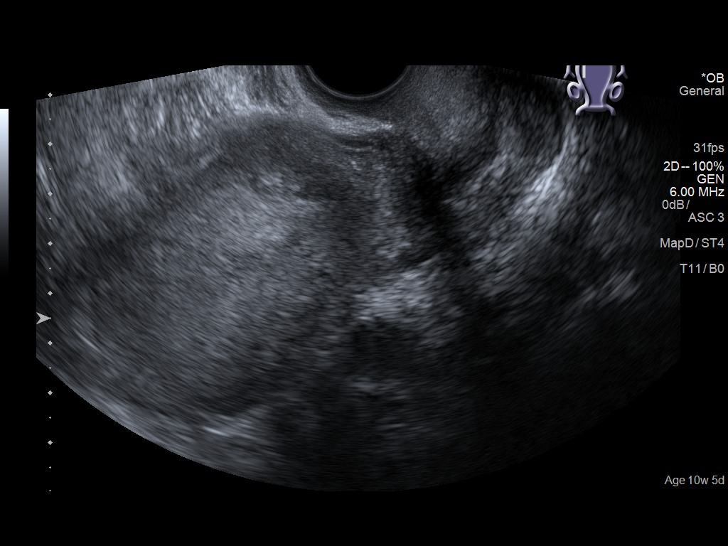
[im 77/99]
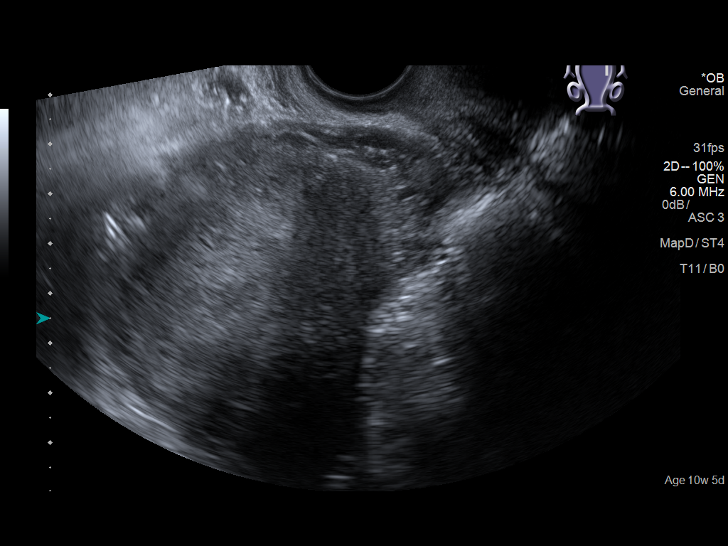
[im 84/99]
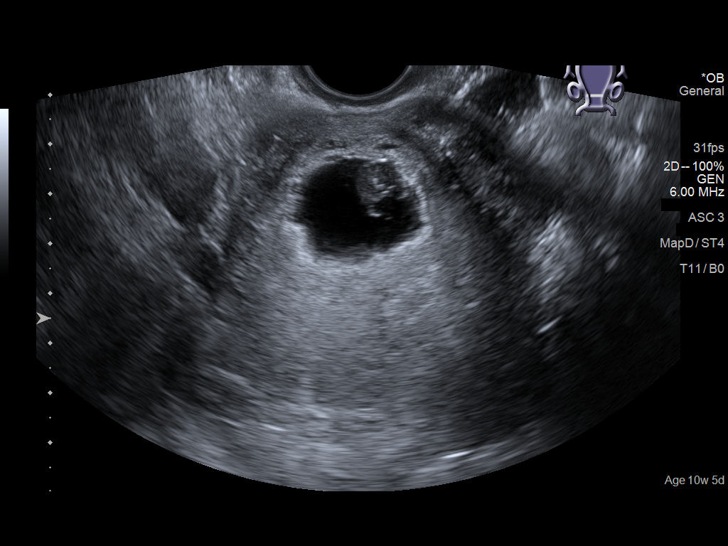
[im 91/99]
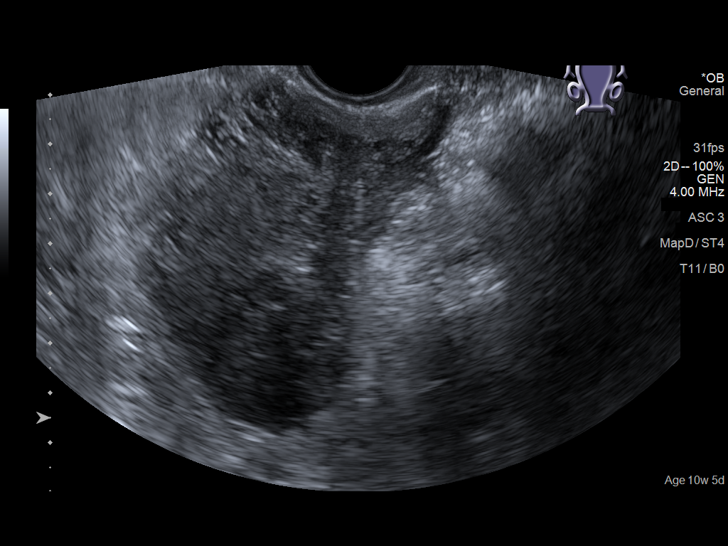
[im 99/99]
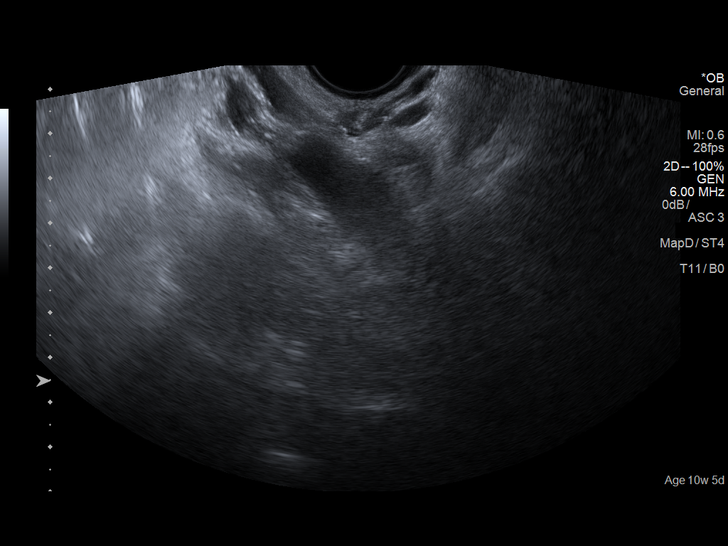

[14 of 28 positions shown; findings below may reference images not displayed]

FINDINGS: Intrauterine gestational sac: Single

Embryo:  Present.

Cardiac Activity: None detected with M-mode Doppler, CT, or color
Doppler.

Heart Rate: 0  bpm

CRL:  2.23 cm  mm   8 w   6 d                  US EDC: 04/21/2016

Subchorionic hemorrhage:  None visualized.

Maternal uterus/adnexae: Fundal fibroid measuring 2.3 x 2.8 x
cm. Bilateral ovaries are morphologically normal. The left ovary has
a corpus luteal cyst measuring 2.4 x 1.7 x 2.0 cm.
IMPRESSION: Findings meet definitive criteria for failed pregnancy. This follows
SRU consensus guidelines: Diagnostic Criteria for Nonviable
Pregnancy Early in the First Trimester. N Engl J Med

By: Adelaido Thiessen M.D.
